# Patient Record
Sex: Male | Born: 1950
Health system: Southern US, Community
[De-identification: ages and names within clinical notes are randomized; demographics above are authoritative.]

## PROBLEM LIST (undated history)

## (undated) DIAGNOSIS — K219 Gastro-esophageal reflux disease without esophagitis: Secondary | ICD-10-CM

## (undated) DIAGNOSIS — H269 Unspecified cataract: Secondary | ICD-10-CM

## (undated) DIAGNOSIS — M1990 Unspecified osteoarthritis, unspecified site: Secondary | ICD-10-CM

## (undated) DIAGNOSIS — T7840XA Allergy, unspecified, initial encounter: Secondary | ICD-10-CM

## (undated) DIAGNOSIS — I10 Essential (primary) hypertension: Secondary | ICD-10-CM

## (undated) DIAGNOSIS — G4730 Sleep apnea, unspecified: Secondary | ICD-10-CM

## (undated) HISTORY — DX: Allergy, unspecified, initial encounter: T78.40XA

## (undated) HISTORY — PX: CATARACT EXTRACTION: SUR2

## (undated) HISTORY — DX: Unspecified osteoarthritis, unspecified site: M19.90

## (undated) HISTORY — DX: Unspecified cataract: H26.9

## (undated) HISTORY — DX: Gastro-esophageal reflux disease without esophagitis: K21.9

## (undated) HISTORY — PX: COLONOSCOPY: SHX174

## (undated) HISTORY — PX: ABCESS DRAINAGE: SHX399

## (undated) HISTORY — DX: Sleep apnea, unspecified: G47.30

---

## 2013-03-04 ENCOUNTER — Encounter (HOSPITAL_COMMUNITY): Payer: Self-pay | Admitting: Respiratory Therapy

## 2013-03-05 ENCOUNTER — Encounter (HOSPITAL_COMMUNITY)
Admission: RE | Admit: 2013-03-05 | Discharge: 2013-03-05 | Disposition: A | Discharge status: Home / Self Care | Payer: Worker's Compensation | Source: Ambulatory Visit | Attending: Orthopedic Surgery | Admitting: Orthopedic Surgery

## 2013-03-05 ENCOUNTER — Encounter (HOSPITAL_COMMUNITY)
Admission: RE | Admit: 2013-03-05 | Discharge: 2013-03-05 | Disposition: A | Discharge status: Home / Self Care | Payer: Worker's Compensation | Source: Ambulatory Visit | Attending: Anesthesiology | Admitting: Anesthesiology

## 2013-03-05 ENCOUNTER — Encounter (HOSPITAL_COMMUNITY): Payer: Self-pay

## 2013-03-05 ENCOUNTER — Other Ambulatory Visit: Payer: Self-pay | Admitting: Orthopedic Surgery

## 2013-03-05 HISTORY — DX: Essential (primary) hypertension: I10

## 2013-03-05 LAB — CBC
HCT: 43.2 % (ref 39.0–52.0)
Hemoglobin: 15 g/dL (ref 13.0–17.0)
MCH: 31.8 pg (ref 26.0–34.0)
MCHC: 34.7 g/dL (ref 30.0–36.0)
RBC: 4.71 MIL/uL (ref 4.22–5.81)

## 2013-03-05 LAB — BASIC METABOLIC PANEL
BUN: 13 mg/dL (ref 6–23)
Chloride: 100 mEq/L (ref 96–112)
Glucose, Bld: 100 mg/dL — ABNORMAL HIGH (ref 70–99)
Potassium: 3.7 mEq/L (ref 3.5–5.1)
Sodium: 138 mEq/L (ref 135–145)

## 2013-03-05 NOTE — Progress Notes (Signed)
Requested orders from Dr. Carlos Levering office

## 2013-03-05 NOTE — Pre-Procedure Instructions (Signed)
Gottlieb Zuercher  03/05/2013   Your procedure is scheduled on:  June 21  Report to [facility] Enter through main entrance A, Take east elevators to 3rd floor at 09:30 AM.  Call this number if you have problems the morning of surgery: [phone]   Remember:   Do not eat food or drink liquids after midnight.   Take these medicines the morning of surgery with A SIP OF WATER: None   Do not wear jewelry, make-up or nail polish.  Do not wear lotions, powders, or perfumes. You may wear deodorant.  Do not shave 48 hours prior to surgery. Men may shave face and neck.  Do not bring valuables to the hospital.  [facility] is not responsible for any belongings or valuables.  Contacts, dentures or bridgework may not be worn into surgery.  Leave suitcase in the car. After surgery it may be brought to your room.  For patients admitted to the hospital, checkout time is 11:00 AM the day of discharge.   Patients discharged the day of surgery will not be allowed to drive home.  Name and phone number of your driver: Family/ Friend  Special Instructions: Shower using CHG 2 nights before surgery and the night before surgery.  If you shower the day of surgery use CHG.  Use special wash - you have one bottle of CHG for all showers.  You should use approximately 1/3 of the bottle for each shower.   Please read over the following fact sheets that you were given: Pain Booklet, Coughing and Deep Breathing and Surgical Site Infection Prevention

## 2013-03-06 ENCOUNTER — Observation Stay (HOSPITAL_COMMUNITY)
Admission: RE | Admit: 2013-03-06 | Discharge: 2013-03-07 | Disposition: A | Discharge status: Home / Self Care | Payer: Worker's Compensation | Source: Ambulatory Visit | Attending: Orthopedic Surgery | Admitting: Orthopedic Surgery

## 2013-03-06 ENCOUNTER — Ambulatory Visit (HOSPITAL_COMMUNITY): Payer: Worker's Compensation | Admitting: Certified Registered Nurse Anesthetist

## 2013-03-06 ENCOUNTER — Encounter (HOSPITAL_COMMUNITY)
Admission: RE | Disposition: A | Discharge status: Home / Self Care | Payer: Self-pay | Source: Ambulatory Visit | Attending: Orthopedic Surgery

## 2013-03-06 ENCOUNTER — Encounter (HOSPITAL_COMMUNITY): Payer: Self-pay | Admitting: Certified Registered Nurse Anesthetist

## 2013-03-06 DIAGNOSIS — Z01812 Encounter for preprocedural laboratory examination: Secondary | ICD-10-CM | POA: Insufficient documentation

## 2013-03-06 DIAGNOSIS — S52509A Unspecified fracture of the lower end of unspecified radius, initial encounter for closed fracture: Secondary | ICD-10-CM | POA: Insufficient documentation

## 2013-03-06 DIAGNOSIS — Z01810 Encounter for preprocedural cardiovascular examination: Secondary | ICD-10-CM | POA: Insufficient documentation

## 2013-03-06 DIAGNOSIS — I10 Essential (primary) hypertension: Secondary | ICD-10-CM | POA: Insufficient documentation

## 2013-03-06 DIAGNOSIS — X58XXXA Exposure to other specified factors, initial encounter: Secondary | ICD-10-CM | POA: Insufficient documentation

## 2013-03-06 DIAGNOSIS — S52609A Unspecified fracture of lower end of unspecified ulna, initial encounter for closed fracture: Secondary | ICD-10-CM | POA: Insufficient documentation

## 2013-03-06 DIAGNOSIS — S52531A Colles' fracture of right radius, initial encounter for closed fracture: Secondary | ICD-10-CM

## 2013-03-06 DIAGNOSIS — S52599A Other fractures of lower end of unspecified radius, initial encounter for closed fracture: Principal | ICD-10-CM | POA: Insufficient documentation

## 2013-03-06 HISTORY — PX: ORIF WRIST FRACTURE: SHX2133

## 2013-03-06 SURGERY — OPEN REDUCTION INTERNAL FIXATION (ORIF) WRIST FRACTURE
Anesthesia: General | Site: Wrist | Laterality: Right | Wound class: Clean

## 2013-03-06 MED ORDER — ONDANSETRON HCL 4 MG/2ML IJ SOLN: 4.0000 mg | Freq: Four times a day (QID) | INTRAMUSCULAR | Status: DC | PRN

## 2013-03-06 MED ORDER — ONDANSETRON HCL 4 MG PO TABS: 4.0000 mg | ORAL_TABLET | Freq: Four times a day (QID) | ORAL | Status: DC | PRN

## 2013-03-06 MED ORDER — ONDANSETRON HCL 4 MG/2ML IJ SOLN
INTRAMUSCULAR | Status: DC | PRN
  Administered 2013-03-06: 4 mg via INTRAVENOUS

## 2013-03-06 MED ORDER — OXYCODONE HCL 5 MG/5ML PO SOLN: 5.0000 mg | Freq: Once | ORAL | Status: DC | PRN

## 2013-03-06 MED ORDER — CEFAZOLIN SODIUM-DEXTROSE 2-3 GM-% IV SOLR
2.0000 g | INTRAVENOUS | Status: AC
  Administered 2013-03-06: 2 g via INTRAVENOUS

## 2013-03-06 MED ORDER — VITAMIN C 500 MG PO TABS
1000.0000 mg | ORAL_TABLET | Freq: Every day | ORAL | Status: DC
  Administered 2013-03-07: 1000 mg via ORAL
  Filled 2013-03-06: qty 2

## 2013-03-06 MED ORDER — SODIUM CHLORIDE 0.9 % IR SOLN
Status: DC | PRN
  Administered 2013-03-06: 1000 mL

## 2013-03-06 MED ORDER — DOCUSATE SODIUM 100 MG PO CAPS
100.0000 mg | ORAL_CAPSULE | Freq: Two times a day (BID) | ORAL | Status: DC
  Administered 2013-03-06 – 2013-03-07 (×2): 100 mg via ORAL
  Filled 2013-03-06 (×2): qty 1

## 2013-03-06 MED ORDER — HYDROCHLOROTHIAZIDE 12.5 MG PO CAPS
12.5000 mg | ORAL_CAPSULE | Freq: Every day | ORAL | Status: DC
  Administered 2013-03-06 – 2013-03-07 (×2): 12.5 mg via ORAL
  Filled 2013-03-06 (×2): qty 1

## 2013-03-06 MED ORDER — PROMETHAZINE HCL 25 MG RE SUPP: 12.5000 mg | Freq: Four times a day (QID) | RECTAL | Status: DC | PRN

## 2013-03-06 MED ORDER — ONDANSETRON HCL 4 MG/2ML IJ SOLN: 4.0000 mg | Freq: Once | INTRAMUSCULAR | Status: DC | PRN

## 2013-03-06 MED ORDER — FAMOTIDINE 20 MG PO TABS
20.0000 mg | ORAL_TABLET | Freq: Two times a day (BID) | ORAL | Status: DC | PRN
  Filled 2013-03-06: qty 1

## 2013-03-06 MED ORDER — HYDROMORPHONE HCL PF 1 MG/ML IJ SOLN: 0.2500 mg | INTRAMUSCULAR | Status: DC | PRN

## 2013-03-06 MED ORDER — CEFAZOLIN SODIUM 1-5 GM-% IV SOLN
1.0000 g | INTRAVENOUS | Status: AC
  Administered 2013-03-06: 1 g via INTRAVENOUS

## 2013-03-06 MED ORDER — CEFAZOLIN SODIUM 1-5 GM-% IV SOLN
1.0000 g | Freq: Three times a day (TID) | INTRAVENOUS | Status: DC
  Administered 2013-03-06 – 2013-03-07 (×3): 1 g via INTRAVENOUS
  Filled 2013-03-06 (×4): qty 50

## 2013-03-06 MED ORDER — MORPHINE SULFATE 2 MG/ML IJ SOLN: 1.0000 mg | INTRAMUSCULAR | Status: DC | PRN

## 2013-03-06 MED ORDER — LACTATED RINGERS IV SOLN
INTRAVENOUS | Status: DC | PRN
  Administered 2013-03-06 (×2): via INTRAVENOUS

## 2013-03-06 MED ORDER — CEFAZOLIN SODIUM 1-5 GM-% IV SOLN
INTRAVENOUS | Status: AC
  Filled 2013-03-06: qty 50

## 2013-03-06 MED ORDER — LACTATED RINGERS IV SOLN
INTRAVENOUS | Status: DC
  Administered 2013-03-06: 16:00:00 via INTRAVENOUS

## 2013-03-06 MED ORDER — MIDAZOLAM HCL 2 MG/2ML IJ SOLN
INTRAMUSCULAR | Status: AC
  Filled 2013-03-06: qty 2

## 2013-03-06 MED ORDER — LACTATED RINGERS IV SOLN: INTRAVENOUS | Status: DC

## 2013-03-06 MED ORDER — OXYCODONE HCL 5 MG PO TABS
5.0000 mg | ORAL_TABLET | ORAL | Status: DC | PRN
  Administered 2013-03-06: 5 mg via ORAL
  Administered 2013-03-07: 10 mg via ORAL
  Filled 2013-03-06: qty 1
  Filled 2013-03-06: qty 2

## 2013-03-06 MED ORDER — DEXAMETHASONE SODIUM PHOSPHATE 10 MG/ML IJ SOLN
INTRAMUSCULAR | Status: DC | PRN
  Administered 2013-03-06: 10 mg

## 2013-03-06 MED ORDER — CHLORHEXIDINE GLUCONATE 4 % EX LIQD: 60.0000 mL | Freq: Once | CUTANEOUS | Status: DC

## 2013-03-06 MED ORDER — METHOCARBAMOL 100 MG/ML IJ SOLN
500.0000 mg | Freq: Four times a day (QID) | INTRAVENOUS | Status: DC | PRN
  Filled 2013-03-06: qty 5

## 2013-03-06 MED ORDER — CEFAZOLIN SODIUM-DEXTROSE 2-3 GM-% IV SOLR
INTRAVENOUS | Status: AC
  Filled 2013-03-06: qty 50

## 2013-03-06 MED ORDER — BUPIVACAINE-EPINEPHRINE PF 0.5-1:200000 % IJ SOLN
INTRAMUSCULAR | Status: DC | PRN
  Administered 2013-03-06: 25 mL

## 2013-03-06 MED ORDER — ADULT MULTIVITAMIN W/MINERALS CH
1.0000 | ORAL_TABLET | Freq: Every day | ORAL | Status: DC
  Administered 2013-03-07: 1 via ORAL
  Filled 2013-03-06: qty 1

## 2013-03-06 MED ORDER — LIDOCAINE HCL (CARDIAC) 20 MG/ML IV SOLN
INTRAVENOUS | Status: DC | PRN
  Administered 2013-03-06: 100 mg via INTRAVENOUS

## 2013-03-06 MED ORDER — FENTANYL CITRATE 0.05 MG/ML IJ SOLN
INTRAMUSCULAR | Status: AC
  Filled 2013-03-06: qty 2

## 2013-03-06 MED ORDER — ALPRAZOLAM 0.5 MG PO TABS: 0.5000 mg | ORAL_TABLET | Freq: Four times a day (QID) | ORAL | Status: DC | PRN

## 2013-03-06 MED ORDER — HYDROCHLOROTHIAZIDE 25 MG PO TABS: 12.5000 mg | ORAL_TABLET | Freq: Every day | ORAL | Status: DC

## 2013-03-06 MED ORDER — MIDAZOLAM HCL 5 MG/5ML IJ SOLN
INTRAMUSCULAR | Status: DC | PRN
  Administered 2013-03-06 (×2): 2 mg via INTRAVENOUS

## 2013-03-06 MED ORDER — OXYCODONE HCL 5 MG PO TABS: 5.0000 mg | ORAL_TABLET | Freq: Once | ORAL | Status: DC | PRN

## 2013-03-06 MED ORDER — FENTANYL CITRATE 0.05 MG/ML IJ SOLN
INTRAMUSCULAR | Status: DC | PRN
  Administered 2013-03-06: 100 ug via INTRAVENOUS
  Administered 2013-03-06 (×2): 50 ug via INTRAVENOUS

## 2013-03-06 MED ORDER — PROPOFOL INFUSION 10 MG/ML OPTIME
INTRAVENOUS | Status: DC | PRN
  Administered 2013-03-06: 25 ug/kg/min via INTRAVENOUS

## 2013-03-06 MED ORDER — METHOCARBAMOL 500 MG PO TABS
500.0000 mg | ORAL_TABLET | Freq: Four times a day (QID) | ORAL | Status: DC | PRN
  Administered 2013-03-07: 500 mg via ORAL
  Filled 2013-03-06: qty 1

## 2013-03-06 MED ORDER — PROPOFOL 10 MG/ML IV BOLUS
INTRAVENOUS | Status: DC | PRN
  Administered 2013-03-06: 20 mg via INTRAVENOUS
  Administered 2013-03-06: 10 mg via INTRAVENOUS
  Administered 2013-03-06 (×2): 20 mg via INTRAVENOUS
  Administered 2013-03-06: 30 mg via INTRAVENOUS

## 2013-03-06 SURGICAL SUPPLY — 56 items
BANDAGE ELASTIC 3 VELCRO ST LF (GAUZE/BANDAGES/DRESSINGS) ×2 IMPLANT
BANDAGE ELASTIC 4 VELCRO ST LF (GAUZE/BANDAGES/DRESSINGS) ×2 IMPLANT
BANDAGE GAUZE ELAST BULKY 4 IN (GAUZE/BANDAGES/DRESSINGS) ×2 IMPLANT
BIT DRILL 2.2 SS TIBIAL (BIT) ×2 IMPLANT
BLADE SURG ROTATE 9660 (MISCELLANEOUS) IMPLANT
BNDG ESMARK 4X9 LF (GAUZE/BANDAGES/DRESSINGS) ×2 IMPLANT
CLOTH BEACON ORANGE TIMEOUT ST (SAFETY) ×2 IMPLANT
CORDS BIPOLAR (ELECTRODE) ×2 IMPLANT
COVER SURGICAL LIGHT HANDLE (MISCELLANEOUS) ×2 IMPLANT
CUFF TOURNIQUET SINGLE 18IN (TOURNIQUET CUFF) ×2 IMPLANT
CUFF TOURNIQUET SINGLE 24IN (TOURNIQUET CUFF) IMPLANT
DRAIN TLS ROUND 10FR (DRAIN) IMPLANT
DRAPE OEC MINIVIEW 54X84 (DRAPES) IMPLANT
DRAPE SURG 17X23 STRL (DRAPES) ×2 IMPLANT
DRSG EMULSION OIL 3X3 NADH (GAUZE/BANDAGES/DRESSINGS) ×2 IMPLANT
GAUZE XEROFORM 1X8 LF (GAUZE/BANDAGES/DRESSINGS) ×2 IMPLANT
GLOVE BIOGEL M STRL SZ7.5 (GLOVE) ×2 IMPLANT
GLOVE SS BIOGEL STRL SZ 8 (GLOVE) ×1 IMPLANT
GLOVE SUPERSENSE BIOGEL SZ 8 (GLOVE) ×1
GOWN PREVENTION PLUS XLARGE (GOWN DISPOSABLE) ×2 IMPLANT
GOWN STRL NON-REIN LRG LVL3 (GOWN DISPOSABLE) ×6 IMPLANT
GOWN STRL REIN XL XLG (GOWN DISPOSABLE) ×4 IMPLANT
KIT BASIN OR (CUSTOM PROCEDURE TRAY) ×2 IMPLANT
KIT ROOM TURNOVER OR (KITS) ×2 IMPLANT
LOOP VESSEL MAXI BLUE (MISCELLANEOUS) IMPLANT
MANIFOLD NEPTUNE II (INSTRUMENTS) ×2 IMPLANT
NEEDLE 22X1 1/2 (OR ONLY) (NEEDLE) IMPLANT
NS IRRIG 1000ML POUR BTL (IV SOLUTION) ×2 IMPLANT
PACK ORTHO EXTREMITY (CUSTOM PROCEDURE TRAY) ×2 IMPLANT
PAD ARMBOARD 7.5X6 YLW CONV (MISCELLANEOUS) ×4 IMPLANT
PAD CAST 3X4 CTTN HI CHSV (CAST SUPPLIES) ×1 IMPLANT
PAD CAST 4YDX4 CTTN HI CHSV (CAST SUPPLIES) ×1 IMPLANT
PADDING CAST COTTON 3X4 STRL (CAST SUPPLIES) ×1
PADDING CAST COTTON 4X4 STRL (CAST SUPPLIES) ×1
PEG LOCKING SMOOTH 2.2X18 (Peg) ×4 IMPLANT
PEG LOCKING SMOOTH 2.2X20 (Screw) ×6 IMPLANT
SCREW LOCK 14X2.7X 3 LD TPR (Screw) ×2 IMPLANT
SCREW LOCK 16X2.7X 3 LD TPR (Screw) ×1 IMPLANT
SCREW LOCK 20X2.7X 3 LD TPR (Screw) ×2 IMPLANT
SCREW LOCKING 2.7X14 (Screw) ×2 IMPLANT
SCREW LOCKING 2.7X16 (Screw) ×1 IMPLANT
SCREW LOCKING 2.7X20MM (Screw) ×2 IMPLANT
SCREW NONLOCK 2.7X18MM (Screw) ×2 IMPLANT
SPONGE GAUZE 4X4 12PLY (GAUZE/BANDAGES/DRESSINGS) ×2 IMPLANT
SPONGE LAP 4X18 X RAY DECT (DISPOSABLE) IMPLANT
SUT MNCRL AB 4-0 PS2 18 (SUTURE) IMPLANT
SUT PROLENE 3 0 PS 2 (SUTURE) IMPLANT
SUT PROLENE 4 0 PS 2 18 (SUTURE) ×4 IMPLANT
SUT VIC AB 3-0 FS2 27 (SUTURE) ×4 IMPLANT
SYR CONTROL 10ML LL (SYRINGE) IMPLANT
SYSTEM CHEST DRAIN TLS 7FR (DRAIN) ×2 IMPLANT
TOWEL OR 17X24 6PK STRL BLUE (TOWEL DISPOSABLE) ×2 IMPLANT
TOWEL OR 17X26 10 PK STRL BLUE (TOWEL DISPOSABLE) ×2 IMPLANT
TUBE CONNECTING 12X1/4 (SUCTIONS) ×2 IMPLANT
TUBE EVACUATION TLS (MISCELLANEOUS) ×2 IMPLANT
WATER STERILE IRR 1000ML POUR (IV SOLUTION) ×2 IMPLANT

## 2013-03-06 NOTE — Progress Notes (Signed)
Orthopedic Tech Progress Note Patient Details:  Troy [name] [birth year] [id number]  Ortho Devices Type of Ortho Device: Arm sling Ortho Device/Splint Location: right arm Ortho Device/Splint Interventions: Application   Nikki Dom 03/06/2013, 4:21 PM

## 2013-03-06 NOTE — Op Note (Signed)
See dictation #[id number] Dominica Severin M.D.

## 2013-03-06 NOTE — Op Note (Signed)
NAMEHAMILTON, MARINELLO NO.:  [id number]  MEDICAL RECORD NO.:  [id number]  LOCATION:  [facility]                         FACILITY:  [facility]  PHYSICIAN:  Dionne Ano. Gail Creekmore, M.D.DATE OF BIRTH:  05/07/1951  DATE OF PROCEDURE: DATE OF DISCHARGE:                              OPERATIVE REPORT   PREOPERATIVE DIAGNOSIS:  Comminuted complex greater than 3-part intra- articular distal radius fracture, right upper extremity.  POSTOPERATIVE DIAGNOSIS:  Comminuted complex greater than 3-part intra- articular distal radius fracture, right upper extremity.  PROCEDURE: 1. Open reduction, internal fixation distal radius fracture, right     upper extremity. 2. Stress radiography. 3. Sliding brachioradialis tenotomy. 4. Closed treatment ulnar styloid fracture with evaluation anesthesia.  SURGEON:  Dionne Ano. Amanda Pea, M.D.  ASSISTANT:  Karie Chimera, P.A.-C.  ANESTHESIA:  Block with supplemental MAC.  COMPLICATIONS:  None.  DRAINS:  1.  ESTIMATED BLOOD LOSS:  Minimal.  TOURNIQUET TIME:  Less than an hour.  INDICATIONS:  Pleasant male, who presents with the above-mentioned diagnosis.  I have counseled him in regards to risks and benefits of surgery including risk of infection, bleeding, anesthesia, damage to normal structures, and failure of surgery to accomplish its intended goals of relieving symptoms and restoring function.  With this in mind, he desires to proceed.  All questions have been encouraged and answered preoperatively.  OPERATIVE PROCEDURE:  The patient was seen by myself and anesthesia, taken to the operative suite, preoperatively he had a block placed without difficulty.  I should note he was given preoperative antibiotics, and his MRSA and staph screen were negative.  The patient tolerated the block by Dr. Sondra Come excellently and thus we chose a monitored anesthesia with IV sedation and block for anesthetic purposes. The patient was seen by myself and  anesthesia, underwent smooth induction of monitored anesthesia care with IV sedation.  Arm was prepped and draped in a usual sterile fashion, Betadine scrub and paint. Following this, the patient then underwent final time-out.  Pre and postop check list were completed.  He was well padded, and underwent tourniquet insufflation.  Following this, incision was made volar radially.  Dissection was carried down.  FCR tendon sheath was incised palmarly and dorsally.  Fasciotomy was accomplished approximately. Following this carpal canal contents retracted ulnarly.  Pronator was incised in an L-shaped fashion.  Radial artery was protected.  Carpal canal contents and pronator were elevated in a radial to ulnar direction.  A Jung retractor was then placed.  Following this, we outlined the fracture was comminuted, complex in nature.  It was reduced without difficulty and following reduction, I then applied a DVR plate and screw construct.  I should note that brachioradialis tenotomy was accomplished with sliding technique to decrease the deforming forces of the tendon and help reduce the styloid.  The DVR plate was applied without difficulty, and was accomplished with a standard AO technique.  I take very careful look to make sure and maintain proper length position, etc.  I should note radial high volar inclination and radial inclination were all restored to acceptable geometric patterns.  Following this, I then stress tested the distal radioulnar joint, it was stable in pronation, supination,  and neutral, and thus we chose to treat the ulnar styloid fracture in a closed fashion with the distal radial ulnar joint looking excellent and congruous.  Following this, we irrigated copiously, closed the pronator with Vicryl, and then closed the skin edge with Prolene.  The patient tolerated this well, and there were no complicating features.  He was placed in a short-arm splint.  He will be  monitored in the recovery room.  It has been a night for IV antibiotics, general postoperative observation, and make sure that things go smoothly.  I have discussed with the patient the pre and postop algorithm, etc.  We will plan for suture removal in 12-14 days, and casting between weeks 2 and 4.  Following this, we will plan for a splint at 4 weeks and strengthening at 8 weeks with gentle motion between weeks 4 and 8. These notes had been discussed.  All questions have been encouraged and answered.     Dionne Ano. Amanda Pea, M.D.     [facility]  D:  03/06/2013  T:  03/06/2013  Job:  [id number]

## 2013-03-06 NOTE — Transfer of Care (Signed)
Immediate Anesthesia Transfer of Care Note  Patient: Troy [name]  Procedure(s) Performed: Procedure(s): OPEN REDUCTION INTERNAL FIXATION (ORIF) RIGHT WRIST FRACTURE with allograft bone graft and repair reconstruction as necessary (Right)  Patient Location: PACU  Anesthesia Type:MAC and Regional  Level of Consciousness: awake, alert , oriented and patient cooperative  Airway & Oxygen Therapy: Patient Spontanous Breathing  Post-op Assessment: Report given to PACU RN, Post -op Vital signs reviewed and stable and Patient moving all extremities X 4  Post vital signs: Reviewed and stable  Complications: No apparent anesthesia complications

## 2013-03-06 NOTE — Anesthesia Preprocedure Evaluation (Signed)

## 2013-03-06 NOTE — Anesthesia Procedure Notes (Signed)
Anesthesia Regional Block:  Supraclavicular block  Pre-Anesthetic Checklist: ,, timeout performed, Correct Patient, Correct Site, Correct Laterality, Correct Procedure, Correct Position, site marked, Risks and benefits discussed,  Surgical consent,  Pre-op evaluation,  At surgeon's request and post-op pain management  Laterality: Right and Upper  Prep: chloraprep       Needles:  Injection technique: Single-shot  Needle Type: Echogenic Needle     Needle Length: 5cm 5 cm Needle Gauge: 21    Additional Needles:  Procedures: ultrasound guided (picture in chart) Supraclavicular block Narrative:  Start time: 03/06/2013 10:21 AM End time: 03/06/2013 10:27 AM Injection made incrementally with aspirations every 5 mL.  Performed by: Personally  Anesthesiologist: Sheldon Silvan  Supraclavicular block

## 2013-03-06 NOTE — Preoperative (Signed)
Beta Blockers   Reason not to administer Beta Blockers:Not Applicable 

## 2013-03-06 NOTE — Anesthesia Postprocedure Evaluation (Signed)
  Anesthesia Post-op Note  Patient: Troy [name]  Procedure(s) Performed: Procedure(s): OPEN REDUCTION INTERNAL FIXATION (ORIF) RIGHT WRIST FRACTURE with allograft bone graft and repair reconstruction as necessary (Right)  Patient Location: PACU  Anesthesia Type:MAC and GA combined with regional for post-op pain  Level of Consciousness: awake, alert  and oriented  Airway and Oxygen Therapy: Patient Spontanous Breathing  Post-op Pain: none  Post-op Assessment: Post-op Vital signs reviewed  Post-op Vital Signs: Reviewed  Complications: No apparent anesthesia complications

## 2013-03-06 NOTE — H&P (Signed)
Troy [name] is an 62 y.o. male.   Chief Complaint: Right Radius fracture closed comminuted with displacement HPI: Patient presents for open reduction internal fixation of his radius fracture. He understands risk and benefits and desires to proceed.Marland KitchenMarland KitchenPatient presents for evaluation and treatment of the of their upper extremity predicament. The patient denies neck back chest or of abdominal pain. The patient notes that they have no lower extremity problems. The patient from primarily complains of the upper extremity pain noted.  Past Medical History  Diagnosis Date  . Hypertension     does not see a cardiologist    Past Surgical History  Procedure Laterality Date  . Abcess drainage      right neck    No family history on file. Social History:  reports that he has never smoked. He has never used smokeless tobacco. He reports that he does not drink alcohol or use illicit drugs.  Allergies:  Allergies  Allergen Reactions  . Clindamycin/Lincomycin Hives    Medications Prior to Admission  Medication Sig Dispense Refill  . hydrochlorothiazide (HYDRODIURIL) 12.5 MG tablet Take 12.5 mg by mouth daily.      . Multiple Vitamin (MULTIVITAMIN WITH MINERALS) TABS Take 1 tablet by mouth daily.        Results for orders placed during the hospital encounter of 03/05/13 (from the past 48 hour(s))  BASIC METABOLIC PANEL     Status: Abnormal   Collection Time    03/05/13 11:56 AM      Result Value Range   Sodium 138  135 - 145 mEq/L   Potassium 3.7  3.5 - 5.1 mEq/L   Chloride 100  96 - 112 mEq/L   CO2 28  19 - 32 mEq/L   Glucose, Bld 100 (*) 70 - 99 mg/dL   BUN 13  6 - 23 mg/dL   Creatinine, Ser 6.21  0.50 - 1.35 mg/dL   Calcium 9.4  8.4 - 30.8 mg/dL   GFR calc non Af Amer 79 (*) >90 mL/min   GFR calc Af Amer >90  >90 mL/min   Comment:            The eGFR has been calculated     using the CKD EPI equation.     This calculation has not been     validated in all clinical   situations.     eGFR's persistently     <90 mL/min signify     possible Chronic Kidney Disease.  CBC     Status: None   Collection Time    03/05/13 11:56 AM      Result Value Range   WBC 7.7  4.0 - 10.5 K/uL   RBC 4.71  4.22 - 5.81 MIL/uL   Hemoglobin 15.0  13.0 - 17.0 g/dL   HCT 65.7  84.6 - 96.2 %   MCV 91.7  78.0 - 100.0 fL   MCH 31.8  26.0 - 34.0 pg   MCHC 34.7  30.0 - 36.0 g/dL   RDW 95.2  84.1 - 32.4 %   Platelets 250  150 - 400 K/uL  SURGICAL PCR SCREEN     Status: None   Collection Time    03/05/13 11:57 AM      Result Value Range   MRSA, PCR NEGATIVE  NEGATIVE   Staphylococcus aureus NEGATIVE  NEGATIVE   Comment:            The Xpert SA Assay (FDA     approved for  NASAL specimens     in patients over 59 years of age),     is one component of     a comprehensive surveillance     program.  Test performance has     been validated by [employer] for patients greater     than or equal to 65 year old.     It is not intended     to diagnose infection nor to     guide or monitor treatment.   Dg Chest 2 View  03/05/2013   *RADIOLOGY REPORT*  Clinical Data: History of hypertension  CHEST - 2 VIEW  Comparison: None.  Findings: Heart and mediastinal contours are within normal limits. The lung fields are well aerated and show some mild increase in interstitial markings suggesting underlying bronchitic change.  No focal infiltrates or signs of congestive failure seen.  No pleural fluid or significant peribronchial cuffing is identified.  Bony structures are notable for degenerative change of the lower thoracic spine are otherwise intact.  IMPRESSION: Findings suggestive of mild bronchitic change with no worrisome focal or acute abnormality seen   Original Report Authenticated By: Rhodia Albright, M.D.    Review of Systems  Constitutional: Negative.   Respiratory: Negative.   Cardiovascular: Negative.   Gastrointestinal: Negative.   Genitourinary: Negative.   Skin:  Negative.   Neurological: Negative.   Endo/Heme/Allergies: Negative.   Psychiatric/Behavioral: Negative.     Blood pressure 131/96, pulse 89, temperature 97.8 F (36.6 C), resp. rate 18, SpO2 96.00%. Physical Exam   fracture of the right radius with comminution and displacement we'll plan for ORIF patient is neurovascularly intact about his fingers with normal sensation and motor function.   [name].The patient is alert and oriented in no acute distress the patient complains of pain in the affected upper extremity.  The patient is noted to have a normal HEENT exam.  Lung fields show equal chest expansion and no shortness of breath  abdomen exam is nontender without distention.  Lower extremity examination does not show any fracture dislocation or blood clot symptoms.  Pelvis is stable neck and back are stable and nontender    A ssessment/Plan Fracture radius closed with comminution- we'll plan open reduction internal fixation  Ahman Dugdale III,Skylor Schnapp M 03/06/2013, 11:28 AM

## 2013-03-07 NOTE — Progress Notes (Signed)
Patient ID: Troy [name], male   DOB: [birth year], 62 y.o.   MRN: [id number] .Marland KitchenPatient has been seen and examined. Patient has pain appropriate to his injury/process. Patient denies new complaints at this present time. I have discussed his care with nursing staff. Patient is appropriate and alert.  We reviewed vital signs and intake output which are stable.  The upper extremity is neurovascularly intact. Refill is normal. There is no signs of compartment syndrome. There is no signs of dystrophy. There is normal sensation.  Lives at great deal of time discussing range of motion lives and other techniques to decrease edema and promote flexion extension of the fingers. Patient understands the importance of elevation range of motion massage and other measures to lessen pain and prevent swelling.  We have discussed with the patient shoulder range of motion to prevent adhesive capsulitis.  The remainder of the examination is normal today without complicating feature.  Drain was removed without difficulty  Patient will be discharged home. Will plan to see the patient back in the off as per discharge instructions (please see discharge instructions).  Patient had an uneventful hospital course. At the time of discharge patient is stable awake alert and oriented in no acute distress. Regular diet will be continued and has been tolerated. Patient will notify should have problems occur. There is no signs of DVT infection or other complication at this juncture.  All questions have been incurred and answered.  Dominica Severin

## 2013-03-07 NOTE — Discharge Summary (Signed)
..  Patient has been seen and examined. Patient has pain appropriate to his injury/process. Patient denies new complaints at this present time. I have discussed his care with nursing staff. Patient is appropriate and alert.  We reviewed vital signs and intake output which are stable.  The upper extremity is neurovascularly intact. Refill is normal. There is no signs of compartment syndrome. There is no signs of dystrophy. There is normal sensation.  Lives at great deal of time discussing range of motion lives and other techniques to decrease edema and promote flexion extension of the fingers. Patient understands the importance of elevation range of motion massage and other measures to lessen pain and prevent swelling.  We have discussed with the patient shoulder range of motion to prevent adhesive capsulitis.  The remainder of the examination is normal today without complicating feature.  Drain was removed without difficulty  Patient will be discharged home. Will plan to see the patient back in the off as per discharge instructions (please see discharge instructions).  Patient had an uneventful hospital course. At the time of discharge patient is stable awake alert and oriented in no acute distress. Regular diet will be continued and has been tolerated. Patient will notify should have problems occur. There is no signs of DVT infection or other complication at this juncture.  All questions have been incurred and answered.  Dominica Severin

## 2013-03-07 NOTE — Progress Notes (Signed)
Occupational Therapy Evaluation Patient Details Name: Troy [name] MRN: [id number] DOB: 05-Apr-1951 Today's Date: 03/07/2013 Time: 7829-5621 OT Time Calculation (min): 14 min  OT Assessment / Plan / Recommendation Clinical Impression  Patient presents s/p surgical repair of R wrist fx. All education complete and patient able to manage ADLs with PRN A from girlfriend. No further OT needs.    OT Assessment  Patient does not need any further OT services    Follow Up Recommendations  No OT follow up    Barriers to Discharge      Equipment Recommendations  None recommended by OT    Recommendations for Other Services    Frequency       Precautions / Restrictions Precautions Required Braces or Orthoses: Other Brace/Splint (sling RUE) Restrictions Weight Bearing Restrictions: Yes   Pertinent Vitals/Pain     ADL  Eating/Feeding: Performed;Independent Where Assessed - Eating/Feeding: Edge of bed Grooming: Performed;Teeth care;Wash/dry face;Independent Where Assessed - Grooming: Unsupported standing Upper Body Bathing: Performed;Modified independent Where Assessed - Upper Body Bathing: Unsupported standing Lower Body Bathing: Performed;Modified independent Where Assessed - Lower Body Bathing: Unsupported sit to stand Upper Body Dressing: Performed;Modified independent Where Assessed - Upper Body Dressing: Unsupported sitting Lower Body Dressing: Performed;Modified independent Where Assessed - Lower Body Dressing: Unsupported sit to stand Toilet Transfer: Performed;Independent Toilet Transfer Method: Sit to Barista: Regular height toilet Toileting - Clothing Manipulation and Hygiene: Performed;Independent Where Assessed - Toileting Clothing Manipulation and Hygiene: Sit to stand from 3-in-1 or toilet Tub/Shower Transfer: Simulated;Modified independent Tub/Shower Transfer Method: Ambulating Transfers/Ambulation Related to ADLs: Patient is I with  transfers and ambulation. ADL Comments: Patient educated on one-handed techniques for ADLs but has had to utilize these prior to surgery so he is able to do all ADLs. MD office gave him a plastic cover for his arm so he can shower at home. Educated to have girlfriend there for first shower.    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 Toilet: Standard Home Adaptive Equipment: None Prior Function Level of Independence: Independent Able to Take Stairs?: Yes Driving: Yes Vocation: Full time employment Communication Communication: No difficulties Dominant Hand: Right         End of Session OT - End of Session Activity Tolerance: Patient tolerated treatment well Patient left: in bed;with call bell/phone within reach  GO Functional Limitation: Self care Self Care Current Status (H0865): At least 1 percent but less than 20 percent impaired, limited or restricted Self Care Goal Status (H8469): At least 1 percent but less than 20 percent impaired, limited or restricted Self Care Discharge Status [phone]): At least 1 percent but less than 20 percent impaired, limited or restricted   Troy [name] A 03/07/2013, 11:26 AM

## 2013-03-09 ENCOUNTER — Encounter (HOSPITAL_COMMUNITY): Payer: Self-pay | Admitting: Orthopedic Surgery

## 2014-03-19 IMAGING — CR DG CHEST 2V
2 series · 2 of 2 positions shown · non-contrast
Comparison: None.

CLINICAL DATA: History of hypertension

CHEST - 2 VIEW

[view not recorded (1 of 2)]
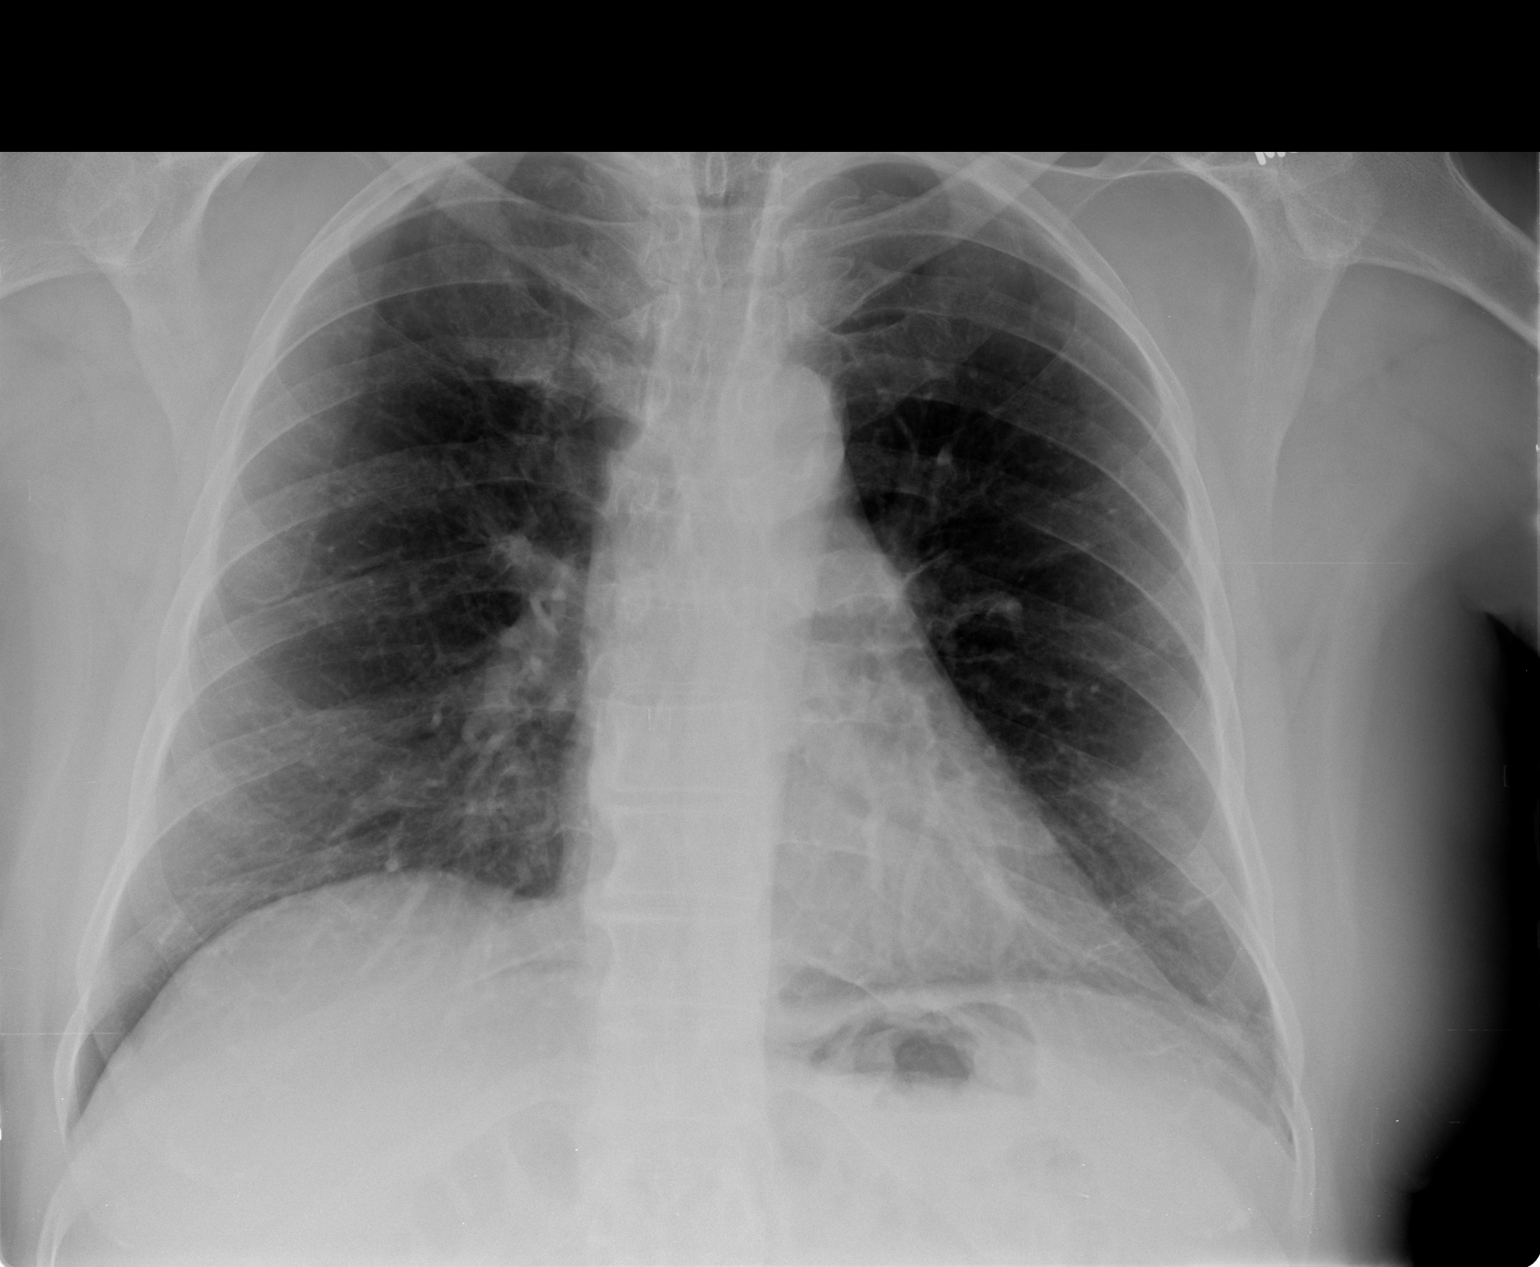

[view not recorded (2 of 2)]
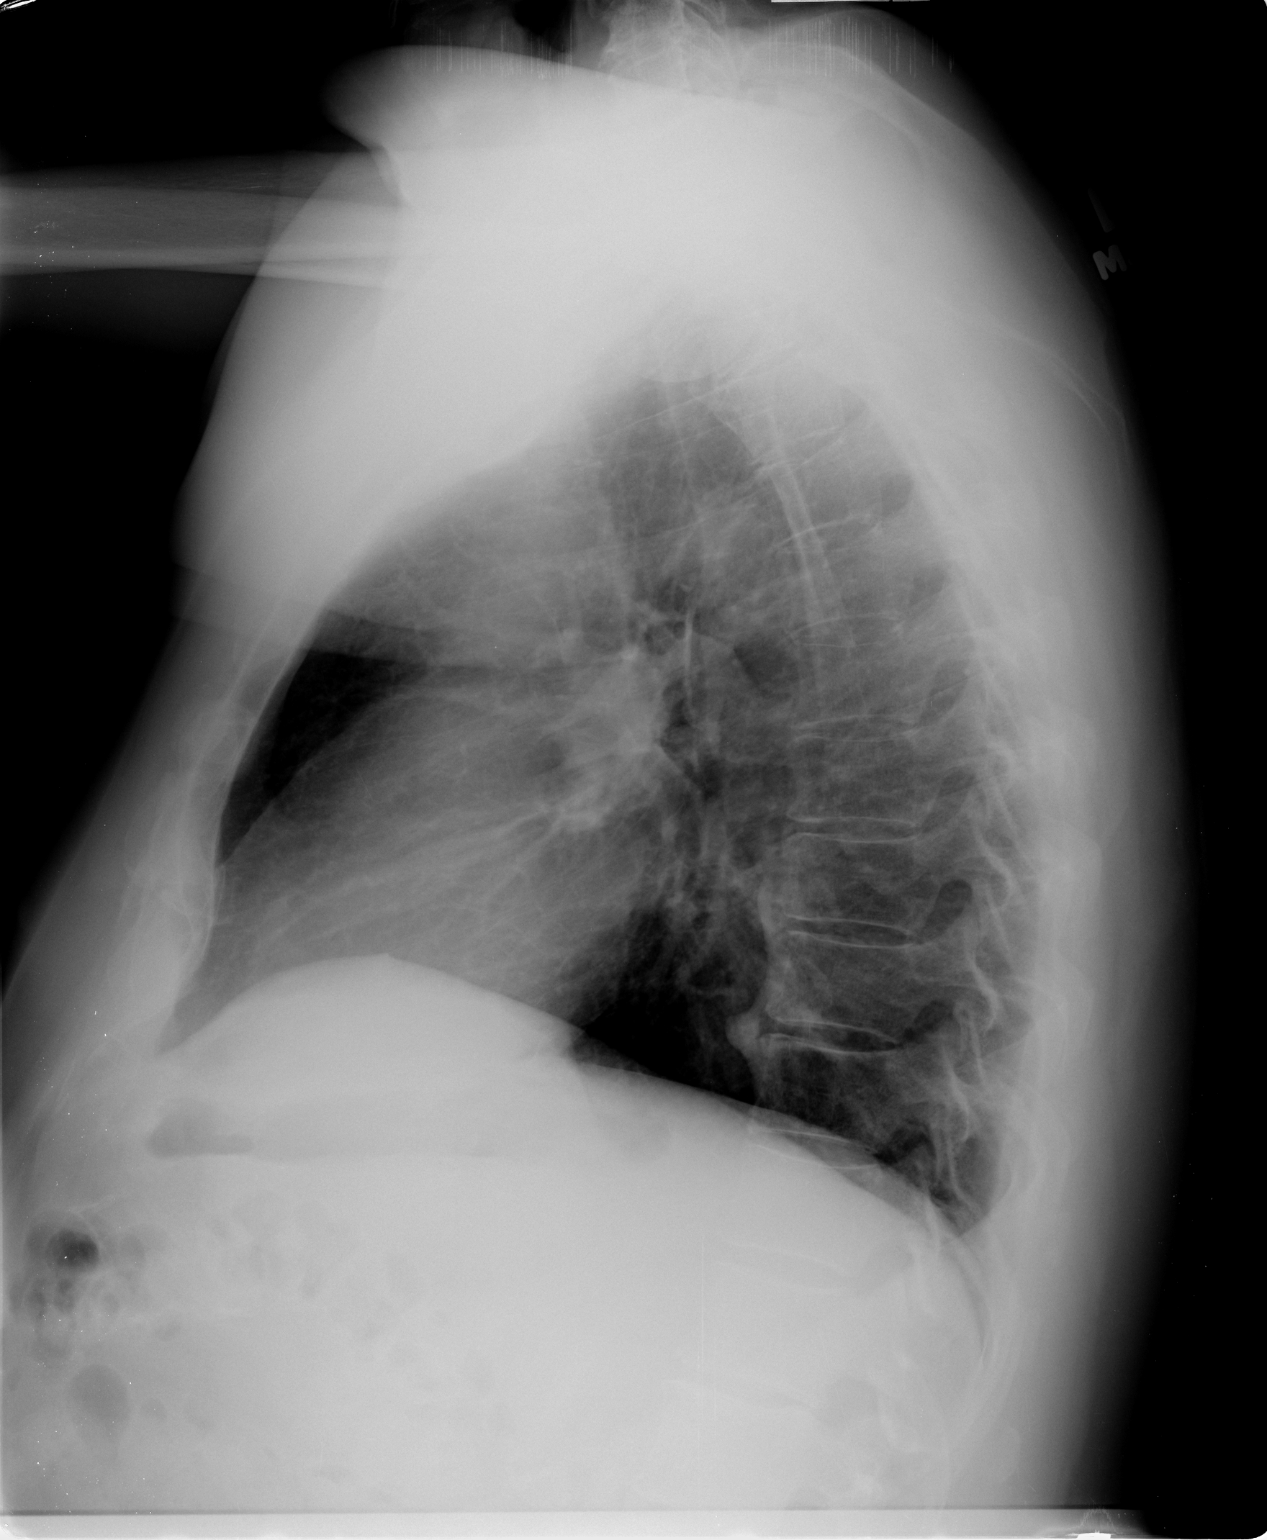

[2 of 2 positions shown; findings below may reference images not displayed]

FINDINGS: Heart and mediastinal contours are within normal limits.
The lung fields are well aerated and show some mild increase in
interstitial markings suggesting underlying bronchitic change.  No
focal infiltrates or signs of congestive failure seen.  No pleural
fluid or significant peribronchial cuffing is identified.

Bony structures are notable for degenerative change of the lower
thoracic spine are otherwise intact.
IMPRESSION: Findings suggestive of mild bronchitic change with no worrisome
focal or acute abnormality seen

## 2014-12-16 ENCOUNTER — Encounter: Payer: Self-pay | Admitting: Internal Medicine

## 2015-02-01 ENCOUNTER — Ambulatory Visit (AMBULATORY_SURGERY_CENTER): Payer: Self-pay | Admitting: *Deleted

## 2015-02-01 VITALS — Ht 70.0 in | Wt 227.0 lb

## 2015-02-01 DIAGNOSIS — Z1211 Encounter for screening for malignant neoplasm of colon: Secondary | ICD-10-CM

## 2015-02-01 MED ORDER — NA SULFATE-K SULFATE-MG SULF 17.5-3.13-1.6 GM/177ML PO SOLN
1.0000 | Freq: Once | ORAL | Status: DC
Start: 2015-02-01 — End: 2015-03-10

## 2015-02-01 NOTE — Progress Notes (Signed)
No egg or soy allergy. No anesthesia problems.  No home O2.  No diet meds.  

## 2015-02-08 ENCOUNTER — Telehealth: Payer: Self-pay | Admitting: Internal Medicine

## 2015-02-08 NOTE — Telephone Encounter (Signed)
Called pt back, pt states he is worried about the colonoscopy with his bp being high right now because of his sciatica flare up, pt reports BP of 160/100, asked pt if he has spoken to his primary care doctor, pt states he has and they gave him flexeril and "something else". Pt states he has been in bed and can't move because of the pain, pt states flexeril is not helping much, advised pt he needs to speak to primary care if flexeril is not helping and may need something else, pt said they offered a "shot" but it would stay in his system for 3 weeks and he could not have a colonoscopy because of it so he chose not have it. I advised pt that if he was in that much pain, we could postpone colonoscopy (screening, pt not having any issues), pt wants to wait until Friday 02/10/15 before he decides, pt is going to recheck his BP and see how he feels then call us back by Friday if he needs to reschedule.  Is this ok since he is past his cancellation date? Do you have any additional thoughts?-adm

## 2015-02-08 NOTE — Telephone Encounter (Signed)
There would be no rescheduling or cancellation charges Agree he should contact PCP if not improving  If not improving by weeks end, then would reschedule screening colonoscopy to allow time for improvement in sciatica flare

## 2015-02-09 NOTE — Telephone Encounter (Signed)
Pt called back and said he received injection of kenalog yesterday 03/11/15 so he needs to reschedule his colonoscopy for at least 3 weeks out, rescheduled procedure for 6/28 at 3pm. New instructions were given and mailed, pt will call if any question.-adm

## 2015-02-15 ENCOUNTER — Encounter: Payer: Self-pay | Admitting: Internal Medicine

## 2015-02-16 ENCOUNTER — Encounter: Payer: Self-pay | Admitting: Internal Medicine

## 2015-03-10 ENCOUNTER — Ambulatory Visit (AMBULATORY_SURGERY_CENTER): Payer: 59 | Admitting: Internal Medicine

## 2015-03-10 ENCOUNTER — Encounter: Payer: Self-pay | Admitting: Internal Medicine

## 2015-03-10 VITALS — BP 144/78 | HR 75 | Temp 98.4°F | Resp 19 | Ht 70.0 in | Wt 227.0 lb

## 2015-03-10 DIAGNOSIS — D122 Benign neoplasm of ascending colon: Secondary | ICD-10-CM

## 2015-03-10 DIAGNOSIS — Z1211 Encounter for screening for malignant neoplasm of colon: Secondary | ICD-10-CM

## 2015-03-10 DIAGNOSIS — D123 Benign neoplasm of transverse colon: Secondary | ICD-10-CM | POA: Diagnosis not present

## 2015-03-10 DIAGNOSIS — D120 Benign neoplasm of cecum: Secondary | ICD-10-CM

## 2015-03-10 DIAGNOSIS — D125 Benign neoplasm of sigmoid colon: Secondary | ICD-10-CM

## 2015-03-10 DIAGNOSIS — C180 Malignant neoplasm of cecum: Secondary | ICD-10-CM | POA: Diagnosis not present

## 2015-03-10 MED ORDER — SODIUM CHLORIDE 0.9 % IV SOLN: 500.0000 mL | INTRAVENOUS | Status: DC

## 2015-03-10 NOTE — Op Note (Signed)
[place]  [employer] [address]. [place], [address]   COLONOSCOPY PROCEDURE REPORT  PATIENT: Troy [name], Troy [name]  MR#: [id number] BIRTHDATE: [birth year] , 65  yrs. old GENDER: male ENDOSCOPIST: Jerene Bears, MD REFERRED RJ:JOAC Uppin, M.D. PROCEDURE DATE:  03/10/2015 PROCEDURE:   Colonoscopy, screening, Colonoscopy with snare polypectomy, Colonoscopy with cold biopsy polypectomy, and Submucosal injection, any substance First Screening Colonoscopy - Avg.  risk and is 50 yrs.  old or older Yes.  Prior Negative Screening - Now for repeat screening. N/A  History of Adenoma - Now for follow-up colonoscopy & has been > or = to 3 yrs.  N/A  Polyps removed today? Yes ASA CLASS:   Class II INDICATIONS:Screening for colonic neoplasia and Colorectal Neoplasm Risk Assessment for this procedure is average risk. MEDICATIONS: Monitored anesthesia care, Propofol 700 mg IV, and Lidocaine 40 mg IV  DESCRIPTION OF PROCEDURE:   After the risks benefits and alternatives of the procedure were thoroughly explained, informed consent was obtained.  The digital rectal exam revealed no rectal mass.   The LB [id number]  endoscope was introduced through the anus and advanced to the cecum, which was identified by both the appendix and ileocecal valve. No adverse events experienced. The quality of the prep was good.  (Suprep was used)  The instrument was then slowly withdrawn as the colon was fully examined. Estimated blood loss is zero unless otherwise noted in this procedure report.      COLON FINDINGS: A pedunculated polyp with very thick stalk measuring 40 mm in size with a friable surface was found in the sigmoid colon. This polyp was located at approximately 30 cm from the dentate line. A polypectomy was performed using snare cautery.  The resection was complete, the polyp tissue was completely retrieved and sent to histology. Initially no bleeding at polypectomy site, but within 1  minute of observation, oozing was seen from the polypectomy base.  Bleeding at the site was controlled using hemoclips.  Three (3) placements were made, one clip misfired. There was minimal blood loss from maneuver subsiding by end of procedure. A tattoo was placed proximal and distal to the polypectomy base with [place] ink.    Four sessile polyps ranging between 3-70mm in size were found at the cecum (1) and in the ascending colon (3).  Polypectomies were performed with a cold snare (3) and hot snare (1).  The resection was complete, the polyp tissue was completely retrieved and sent to histology.   Three sessile polyps ranging between 3-79mm in size were found in the transverse colon.  Polypectomies were performed with cold forceps (1) and with a cold snare (2).  The resection was complete, the polyp tissue was completely retrieved and sent to histology. There was moderate diverticulosis noted in the descending colon and sigmoid colon.  Retroflexed views revealed internal hemorrhoids. The time to cecum = 0.6 Withdrawal time = 35.0   The scope was withdrawn and the procedure completed.  COMPLICATIONS: There were no immediate complications.  ENDOSCOPIC IMPRESSION: 1.   Pedunculated polyp was found in the sigmoid colon; polypectomy was performed using snare cautery; bleeding at the site was controlled using hemoclips; mucosal tattoo placed 2.   Four sessile polyps ranging between 3-85mm in size were found at the cecum and in the ascending colon; polypectomies were performed with a cold snare and hot snare 3.   Three sessile polyps ranging between 3-2mm in size were found in the transverse colon; polypectomies were performed with  cold forceps and with a cold snare 4.   Moderate diverticulosis was noted in the descending colon and sigmoid colon  RECOMMENDATIONS: 1.  Hold Aspirin and all other NSAIDs for at least 2 weeks. 2.  Await pathology results 3.  Timing of repeat colonoscopy will  be determined by pathology findings. 4.  You will receive a letter within 1-2 weeks with the results of your biopsy as well as final recommendations.  Please call my office if you have not received a letter after 3 weeks.  eSigned:  Jerene Bears, MD 03/10/2015 4:33 PM   cc: Nicholos Johns MD and The Patient   PATIENT NAME:  Rodell, Marrs MR#: [id number]

## 2015-03-10 NOTE — Progress Notes (Signed)
Called to room to assist during endoscopic procedure.  Patient ID and intended procedure confirmed with present staff. Received instructions for my participation in the procedure from the performing physician.  

## 2015-03-10 NOTE — Patient Instructions (Signed)
YOU HAD AN ENDOSCOPIC PROCEDURE TODAY AT [facility]:   Refer to the procedure report that was given to you for any specific questions about what was found during the examination.  If the procedure report does not answer your questions, please call your gastroenterologist to clarify.  If you requested that your care partner not be given the details of your procedure findings, then the procedure report has been included in a sealed envelope for you to review at your convenience later.  YOU SHOULD EXPECT: Some feelings of bloating in the abdomen. Passage of more gas than usual.  Walking can help get rid of the air that was put into your GI tract during the procedure and reduce the bloating. If you had a lower endoscopy (such as a colonoscopy or flexible sigmoidoscopy) you may notice spotting of blood in your stool or on the toilet paper. If you underwent a bowel prep for your procedure, you may not have a normal bowel movement for a few days.  Please Note:  You might notice some irritation and congestion in your nose or some drainage.  This is from the oxygen used during your procedure.  There is no need for concern and it should clear up in a day or so.  SYMPTOMS TO REPORT IMMEDIATELY:   Following lower endoscopy (colonoscopy or flexible sigmoidoscopy):  Excessive amounts of blood in the stool  Significant tenderness or worsening of abdominal pains  Swelling of the abdomen that is new, acute  Fever of 100F or higher  For urgent or emergent issues, a gastroenterologist can be reached at any hour by calling [phone].   DIET: Your first meal following the procedure should be a small meal and then it is ok to progress to your normal diet. Heavy or fried foods are harder to digest and may make you feel nauseous or bloated.  Likewise, meals heavy in dairy and vegetables can increase bloating.  Drink plenty of fluids but you should avoid alcoholic beverages for 24  hours.  ACTIVITY:  You should plan to take it easy for the rest of today and you should NOT DRIVE or use heavy machinery until tomorrow (because of the sedation medicines used during the test).    FOLLOW UP: Our staff will call the number listed on your records the next business day following your procedure to check on you and address any questions or concerns that you may have regarding the information given to you following your procedure. If we do not reach you, we will leave a message.  However, if you are feeling well and you are not experiencing any problems, there is no need to return our call.  We will assume that you have returned to your regular daily activities without incident.  If any biopsies were taken you will be contacted by phone or by letter within the next 1-3 weeks.  Please call us at [phone] if you have not heard about the biopsies in 3 weeks.    SIGNATURES/CONFIDENTIALITY: You and/or your care partner have signed paperwork which will be entered into your electronic medical record.  These signatures attest to the fact that that the information above on your After Visit Summary has been reviewed and is understood.  Full responsibility of the confidentiality of this discharge information lies with you and/or your care-partner.  Polyp and diverticulosis information given.   Hold all nonsteroidal anti-inflammatory medication and aspirin for 2 weeks.

## 2015-03-10 NOTE — Progress Notes (Signed)
Stable to RR 

## 2015-03-13 ENCOUNTER — Telehealth: Payer: Self-pay | Admitting: *Deleted

## 2015-03-13 NOTE — Telephone Encounter (Signed)
Left message on f/u callback 

## 2015-03-14 ENCOUNTER — Encounter: Payer: Self-pay | Admitting: Internal Medicine

## 2015-03-14 DIAGNOSIS — C187 Malignant neoplasm of sigmoid colon: Secondary | ICD-10-CM | POA: Insufficient documentation

## 2015-03-28 ENCOUNTER — Other Ambulatory Visit: Payer: Self-pay | Admitting: *Deleted

## 2015-07-26 ENCOUNTER — Encounter: Payer: Self-pay | Admitting: Internal Medicine

## 2015-09-14 ENCOUNTER — Ambulatory Visit (AMBULATORY_SURGERY_CENTER): Payer: Self-pay

## 2015-09-14 VITALS — Ht 70.0 in | Wt 228.8 lb

## 2015-09-14 DIAGNOSIS — Z860100 Personal history of colon polyps, unspecified: Secondary | ICD-10-CM

## 2015-09-14 MED ORDER — SUPREP BOWEL PREP KIT 17.5-3.13-1.6 GM/177ML PO SOLN: 1.0000 | Freq: Once | ORAL | Status: DC

## 2015-09-14 NOTE — Progress Notes (Signed)
No allergies to eggs or soy No diet/weight loss meds No home oxygen No past problems with anesthesia  Has email and internet; refused emmi 

## 2015-09-19 ENCOUNTER — Telehealth: Payer: Self-pay | Admitting: Internal Medicine

## 2015-09-19 NOTE — Telephone Encounter (Signed)
Left message for pt to call back  °

## 2015-09-19 NOTE — Telephone Encounter (Signed)
Pt states he takes meloxicam for back discomfort at night and wanted to know if it was ok for him to take it the night prior to his procedure. Let pt know that is fine for him to take.

## 2015-09-21 ENCOUNTER — Encounter: Payer: Self-pay | Admitting: Internal Medicine

## 2015-09-21 ENCOUNTER — Ambulatory Visit (AMBULATORY_SURGERY_CENTER): Payer: BLUE CROSS/BLUE SHIELD | Admitting: Internal Medicine

## 2015-09-21 VITALS — BP 121/82 | HR 83 | Temp 98.1°F | Resp 17 | Ht 70.0 in | Wt 228.0 lb

## 2015-09-21 DIAGNOSIS — K529 Noninfective gastroenteritis and colitis, unspecified: Secondary | ICD-10-CM | POA: Diagnosis not present

## 2015-09-21 DIAGNOSIS — K633 Ulcer of intestine: Secondary | ICD-10-CM | POA: Diagnosis not present

## 2015-09-21 DIAGNOSIS — Z85038 Personal history of other malignant neoplasm of large intestine: Secondary | ICD-10-CM | POA: Diagnosis not present

## 2015-09-21 DIAGNOSIS — Z86010 Personal history of colonic polyps: Secondary | ICD-10-CM | POA: Diagnosis not present

## 2015-09-21 MED ORDER — SODIUM CHLORIDE 0.9 % IV SOLN: 500.0000 mL | INTRAVENOUS | Status: DC

## 2015-09-21 NOTE — Op Note (Signed)
[place]  [employer] [address]. [place], [address]   COLONOSCOPY PROCEDURE REPORT  PATIENT: Troy, [name]  MR#: [id number] BIRTHDATE: 05/07/1951 , 65  yrs. old GENDER: male ENDOSCOPIST: Jerene Bears, MD PROCEDURE DATE:  09/21/2015 PROCEDURE:   Colonoscopy, surveillance and Colonoscopy with biopsy First Screening Colonoscopy - Avg.  risk and is 50 yrs.  old or older - No.  Prior Negative Screening - Now for repeat screening. N/A  History of Adenoma - Now for follow-up colonoscopy & has been > or = to 3 yrs.  No.  It has been less than 3 yrs since last colonoscopy.  Medical reason.  Polyps removed today? No Recommend repeat exam, <10 yrs? Yes high risk ASA CLASS:   Class II INDICATIONS:Surveillance due to prior colonic neoplasia (these multiple tubular adenomas), sigmoid colon cancer arising from pedunculated polyp endoscopically resected June 2016 MEDICATIONS: Monitored anesthesia care and Propofol 300 mg IV  DESCRIPTION OF PROCEDURE:   After the risks benefits and alternatives of the procedure were thoroughly explained, informed consent was obtained.  The digital rectal exam revealed no rectal mass.   The LB [id number]  endoscope was introduced through the anus and advanced to the terminal ileum which was intubated for a short distance. No adverse events experienced.   The quality of the prep was good.  (Suprep was used)  The instrument was then slowly withdrawn as the colon was fully examined. Estimated blood loss is zero unless otherwise noted in this procedure report.      COLON FINDINGS: The examined terminal ileum appeared to be normal. Non-bleeding ulceration was found at the ileocecal valve.  Biopsies were taken around the ulcerations, at the center and at edge of the ulcerations.   There was moderate diverticulosis noted in the transverse colon and left colon.   2 previously placed mucosal tattoos seen in the sigmoid colon at 30 cm.  Between  these tattoos an area of scarring was seen without evidence of residual polyp. Previously placed endoscopic clips also not seen.  No polyps seen today. Retroflexed views revealed internal hemorrhoids. The time to cecum = 2.1 Withdrawal time = 13.2   The scope was withdrawn and the procedure completed. COMPLICATIONS: There were no immediate complications.  ENDOSCOPIC IMPRESSION: 1.   The examined terminal ileum appeared to be normal 2.   Ulcerations found at the ileocecal valve; biopsies were taken 3.   Moderate diverticulosis was noted in the transverse colon and left colon 4.   2 previously placed mucosal tattoo seen in the sigmoid colon at 30 cm.  Between these tattoos an area of scarring was seen without evidence of residual polyp.  RECOMMENDATIONS: 1.  Await pathology results 2.  High fiber diet 3.  Timing of repeat colonoscopy will be determined by pathology findings, likely 2 years given history of colon cancer 6 months ago. 4.  You will receive a letter within 1-2 weeks with the results of your biopsy as well as final recommendations.  Please call my office if you have not received a letter after 3 weeks.  eSigned:  Jerene Bears, MD 09/21/2015 11:09 AM   cc:  the patient, PCP   PATIENT NAME:  [name], Troy MR#: [id number]

## 2015-09-21 NOTE — Patient Instructions (Signed)
YOU HAD AN ENDOSCOPIC PROCEDURE TODAY AT THE [facility]:   Refer to the procedure report that was given to you for any specific questions about what was found during the examination.  If the procedure report does not answer your questions, please call your gastroenterologist to clarify.  If you requested that your care partner not be given the details of your procedure findings, then the procedure report has been included in a sealed envelope for you to review at your convenience later.  YOU SHOULD EXPECT: Some feelings of bloating in the abdomen. Passage of more gas than usual.  Walking can help get rid of the air that was put into your GI tract during the procedure and reduce the bloating. If you had a lower endoscopy (such as a colonoscopy or flexible sigmoidoscopy) you may notice spotting of blood in your stool or on the toilet paper. If you underwent a bowel prep for your procedure, you may not have a normal bowel movement for a few days.  Please Note:  You might notice some irritation and congestion in your nose or some drainage.  This is from the oxygen used during your procedure.  There is no need for concern and it should clear up in a day or so.  SYMPTOMS TO REPORT IMMEDIATELY:   Following lower endoscopy (colonoscopy or flexible sigmoidoscopy):  Excessive amounts of blood in the stool  Significant tenderness or worsening of abdominal pains  Swelling of the abdomen that is new, acute  Fever of 100F or higher    For urgent or emergent issues, a gastroenterologist can be reached at any hour by calling [phone].   DIET: Your first meal following the procedure should be a small meal and then it is ok to progress to your normal diet. Heavy or fried foods are harder to digest and may make you feel nauseous or bloated.  Likewise, meals heavy in dairy and vegetables can increase bloating.  Drink plenty of fluids but you should avoid alcoholic beverages for 24  hours.  ACTIVITY:  You should plan to take it easy for the rest of today and you should NOT DRIVE or use heavy machinery until tomorrow (because of the sedation medicines used during the test).    FOLLOW UP: Our staff will call the number listed on your records the next business day following your procedure to check on you and address any questions or concerns that you may have regarding the information given to you following your procedure. If we do not reach you, we will leave a message.  However, if you are feeling well and you are not experiencing any problems, there is no need to return our call.  We will assume that you have returned to your regular daily activities without incident.  If any biopsies were taken you will be contacted by phone or by letter within the next 1-3 weeks.  Please call us at [phone] if you have not heard about the biopsies in 3 weeks.    SIGNATURES/CONFIDENTIALITY: You and/or your care partner have signed paperwork which will be entered into your electronic medical record.  These signatures attest to the fact that that the information above on your After Visit Summary has been reviewed and is understood.  Full responsibility of the confidentiality of this discharge information lies with you and/or your care-partner.   Resume medications. Information given on diverticulosis,hemorrhoids and high fiber diet. 

## 2015-09-21 NOTE — Progress Notes (Signed)
Stable to RR 

## 2015-09-21 NOTE — Progress Notes (Signed)
Called to room to assist during endoscopic procedure.  Patient ID and intended procedure confirmed with present staff. Received instructions for my participation in the procedure from the performing physician.  

## 2015-09-22 ENCOUNTER — Telehealth: Payer: Self-pay

## 2015-09-22 NOTE — Telephone Encounter (Signed)
Left message on answering machine. 

## 2015-09-26 ENCOUNTER — Encounter: Payer: Self-pay | Admitting: Internal Medicine

## 2017-10-21 ENCOUNTER — Encounter: Payer: Self-pay | Admitting: Internal Medicine

## 2017-10-29 DIAGNOSIS — I10 Essential (primary) hypertension: Secondary | ICD-10-CM | POA: Diagnosis not present

## 2017-10-29 DIAGNOSIS — Z6830 Body mass index (BMI) 30.0-30.9, adult: Secondary | ICD-10-CM | POA: Diagnosis not present

## 2017-10-29 DIAGNOSIS — Z0000 Encounter for general adult medical examination without abnormal findings: Secondary | ICD-10-CM | POA: Diagnosis not present

## 2017-10-29 DIAGNOSIS — N400 Enlarged prostate without lower urinary tract symptoms: Secondary | ICD-10-CM | POA: Diagnosis not present

## 2017-10-29 DIAGNOSIS — Z9181 History of falling: Secondary | ICD-10-CM | POA: Diagnosis not present

## 2017-10-29 DIAGNOSIS — E785 Hyperlipidemia, unspecified: Secondary | ICD-10-CM | POA: Diagnosis not present

## 2017-10-29 DIAGNOSIS — M545 Low back pain: Secondary | ICD-10-CM | POA: Diagnosis not present

## 2017-10-29 DIAGNOSIS — K219 Gastro-esophageal reflux disease without esophagitis: Secondary | ICD-10-CM | POA: Diagnosis not present

## 2017-12-04 DIAGNOSIS — K219 Gastro-esophageal reflux disease without esophagitis: Secondary | ICD-10-CM | POA: Diagnosis not present

## 2017-12-04 DIAGNOSIS — E785 Hyperlipidemia, unspecified: Secondary | ICD-10-CM | POA: Diagnosis not present

## 2017-12-04 DIAGNOSIS — E559 Vitamin D deficiency, unspecified: Secondary | ICD-10-CM | POA: Diagnosis not present

## 2017-12-04 DIAGNOSIS — N400 Enlarged prostate without lower urinary tract symptoms: Secondary | ICD-10-CM | POA: Diagnosis not present

## 2017-12-04 DIAGNOSIS — I10 Essential (primary) hypertension: Secondary | ICD-10-CM | POA: Diagnosis not present

## 2017-12-04 DIAGNOSIS — E669 Obesity, unspecified: Secondary | ICD-10-CM | POA: Diagnosis not present

## 2017-12-04 DIAGNOSIS — Z79899 Other long term (current) drug therapy: Secondary | ICD-10-CM | POA: Diagnosis not present

## 2017-12-04 DIAGNOSIS — J309 Allergic rhinitis, unspecified: Secondary | ICD-10-CM | POA: Diagnosis not present

## 2017-12-04 DIAGNOSIS — Z6831 Body mass index (BMI) 31.0-31.9, adult: Secondary | ICD-10-CM | POA: Diagnosis not present

## 2017-12-04 DIAGNOSIS — H6123 Impacted cerumen, bilateral: Secondary | ICD-10-CM | POA: Diagnosis not present

## 2018-03-18 DIAGNOSIS — N400 Enlarged prostate without lower urinary tract symptoms: Secondary | ICD-10-CM | POA: Diagnosis not present

## 2018-03-18 DIAGNOSIS — E669 Obesity, unspecified: Secondary | ICD-10-CM | POA: Diagnosis not present

## 2018-03-18 DIAGNOSIS — Z131 Encounter for screening for diabetes mellitus: Secondary | ICD-10-CM | POA: Diagnosis not present

## 2018-03-18 DIAGNOSIS — E785 Hyperlipidemia, unspecified: Secondary | ICD-10-CM | POA: Diagnosis not present

## 2018-03-18 DIAGNOSIS — J309 Allergic rhinitis, unspecified: Secondary | ICD-10-CM | POA: Diagnosis not present

## 2018-03-18 DIAGNOSIS — Z79899 Other long term (current) drug therapy: Secondary | ICD-10-CM | POA: Diagnosis not present

## 2018-03-18 DIAGNOSIS — K219 Gastro-esophageal reflux disease without esophagitis: Secondary | ICD-10-CM | POA: Diagnosis not present

## 2018-03-18 DIAGNOSIS — E559 Vitamin D deficiency, unspecified: Secondary | ICD-10-CM | POA: Diagnosis not present

## 2018-03-18 DIAGNOSIS — I10 Essential (primary) hypertension: Secondary | ICD-10-CM | POA: Diagnosis not present

## 2018-03-18 DIAGNOSIS — R0683 Snoring: Secondary | ICD-10-CM | POA: Diagnosis not present

## 2018-03-18 DIAGNOSIS — Z6832 Body mass index (BMI) 32.0-32.9, adult: Secondary | ICD-10-CM | POA: Diagnosis not present

## 2018-05-05 ENCOUNTER — Encounter: Payer: Self-pay | Admitting: Internal Medicine

## 2018-06-10 DIAGNOSIS — I10 Essential (primary) hypertension: Secondary | ICD-10-CM | POA: Diagnosis not present

## 2018-06-10 DIAGNOSIS — K219 Gastro-esophageal reflux disease without esophagitis: Secondary | ICD-10-CM | POA: Diagnosis not present

## 2018-06-10 DIAGNOSIS — E785 Hyperlipidemia, unspecified: Secondary | ICD-10-CM | POA: Diagnosis not present

## 2018-06-10 DIAGNOSIS — Z79899 Other long term (current) drug therapy: Secondary | ICD-10-CM | POA: Diagnosis not present

## 2018-06-10 DIAGNOSIS — M545 Low back pain: Secondary | ICD-10-CM | POA: Diagnosis not present

## 2018-06-10 DIAGNOSIS — R7303 Prediabetes: Secondary | ICD-10-CM | POA: Diagnosis not present

## 2018-06-10 DIAGNOSIS — E559 Vitamin D deficiency, unspecified: Secondary | ICD-10-CM | POA: Diagnosis not present

## 2018-06-10 DIAGNOSIS — Z6832 Body mass index (BMI) 32.0-32.9, adult: Secondary | ICD-10-CM | POA: Diagnosis not present

## 2018-06-10 DIAGNOSIS — Z1339 Encounter for screening examination for other mental health and behavioral disorders: Secondary | ICD-10-CM | POA: Diagnosis not present

## 2018-06-10 DIAGNOSIS — J309 Allergic rhinitis, unspecified: Secondary | ICD-10-CM | POA: Diagnosis not present

## 2018-07-03 ENCOUNTER — Encounter: Payer: Self-pay | Admitting: Internal Medicine

## 2018-07-03 ENCOUNTER — Ambulatory Visit (AMBULATORY_SURGERY_CENTER): Payer: Self-pay

## 2018-07-03 VITALS — Ht 70.0 in | Wt 229.4 lb

## 2018-07-03 DIAGNOSIS — Z86010 Personal history of colonic polyps: Secondary | ICD-10-CM

## 2018-07-03 MED ORDER — NA SULFATE-K SULFATE-MG SULF 17.5-3.13-1.6 GM/177ML PO SOLN: 1.0000 | Freq: Once | ORAL | 0 refills | Status: AC

## 2018-07-03 NOTE — Progress Notes (Signed)
Denies allergies to eggs or soy products. Denies complication of anesthesia or sedation. Denies use of weight loss medication. Denies use of O2.   Emmi instructions declined.  

## 2018-07-16 ENCOUNTER — Telehealth: Payer: Self-pay | Admitting: Internal Medicine

## 2018-07-16 NOTE — Telephone Encounter (Signed)
Patient called about chilling his suprep> called him back and told him he couldn't put the suprep bottles in the refrigerator but he could chill the cold water bottles or pour over ice. Patient verbalized understanding. Sm

## 2018-07-17 ENCOUNTER — Ambulatory Visit (AMBULATORY_SURGERY_CENTER): Payer: Medicare HMO | Admitting: Internal Medicine

## 2018-07-17 ENCOUNTER — Encounter: Payer: Self-pay | Admitting: Internal Medicine

## 2018-07-17 VITALS — BP 94/59 | HR 65 | Temp 97.8°F | Resp 13

## 2018-07-17 DIAGNOSIS — E669 Obesity, unspecified: Secondary | ICD-10-CM | POA: Diagnosis not present

## 2018-07-17 DIAGNOSIS — Z85038 Personal history of other malignant neoplasm of large intestine: Secondary | ICD-10-CM

## 2018-07-17 DIAGNOSIS — D125 Benign neoplasm of sigmoid colon: Secondary | ICD-10-CM | POA: Diagnosis not present

## 2018-07-17 DIAGNOSIS — K635 Polyp of colon: Secondary | ICD-10-CM

## 2018-07-17 DIAGNOSIS — I10 Essential (primary) hypertension: Secondary | ICD-10-CM | POA: Diagnosis not present

## 2018-07-17 MED ORDER — SODIUM CHLORIDE 0.9 % IV SOLN: 500.0000 mL | Freq: Once | INTRAVENOUS | Status: DC

## 2018-07-17 NOTE — Progress Notes (Signed)
To recovery, report to RN, VSS. 

## 2018-07-17 NOTE — Progress Notes (Signed)
Called to room to assist during endoscopic procedure.  Patient ID and intended procedure confirmed with present staff. Received instructions for my participation in the procedure from the performing physician.  

## 2018-07-17 NOTE — Op Note (Signed)
[place] Patient Name: Troy [name] Procedure Date: 07/17/2018 3:13 PM MRN: [id number] Endoscopist: Jerene Bears , MD Age: 67 Referring MD:  Date of Birth: Jul 06, 1951 Gender: Male Account #: [id number] Procedure:                Colonoscopy Indications:              High risk colon cancer surveillance: Personal                            history of colon cancer resected endoscopically in                            2016, Last colonoscopy: 2017 Medicines:                Monitored Anesthesia Care Procedure:                Pre-Anesthesia Assessment:                           - Prior to the procedure, a History and Physical                            was performed, and patient medications and                            allergies were reviewed. The patient's tolerance of                            previous anesthesia was also reviewed. The risks                            and benefits of the procedure and the sedation                            options and risks were discussed with the patient.                            All questions were answered, and informed consent                            was obtained. Prior Anticoagulants: The patient has                            taken no previous anticoagulant or antiplatelet                            agents. ASA Grade Assessment: II - A patient with                            mild systemic disease. After reviewing the risks                            and benefits, the patient was deemed in  satisfactory condition to undergo the procedure.                           After obtaining informed consent, the colonoscope                            was passed under direct vision. Throughout the                            procedure, the patient's blood pressure, pulse, and                            oxygen saturations were monitored continuously. The                            Model [id number]) scope was  introduced                            through the anus and advanced to the cecum,                            identified by appendiceal orifice and ileocecal                            valve. The colonoscopy was performed without                            difficulty. The patient tolerated the procedure                            well. The quality of the bowel preparation was                            good. The ileocecal valve, appendiceal orifice, and                            rectum were photographed. Scope In: 3:18:15 PM Scope Out: 3:32:25 PM Scope Withdrawal Time: 0 hours 9 minutes 42 seconds  Total Procedure Duration: 0 hours 14 minutes 10 seconds  Findings:                 The digital rectal exam was normal.                           A 3 mm polyp was found in the sigmoid colon. The                            polyp was sessile. The polyp was removed with a                            cold snare. Resection and retrieval were complete.                           A tattoo was seen in the sigmoid colon. A  post-polypectomy scar was found at the tattoo site.                            There was no evidence of residual polyp tissue.                           Multiple small and large-mouthed diverticula were                            found in the sigmoid colon and descending colon.                           Internal hemorrhoids were found during                            retroflexion. The hemorrhoids were medium-sized. Complications:            No immediate complications. Estimated Blood Loss:     Estimated blood loss was minimal. Impression:               - One 3 mm polyp in the sigmoid colon, removed with                            a cold snare. Resected and retrieved.                           - A tattoo was seen in the mid sigmoid colon. A                            post-polypectomy scar was found between the                            previously placed  tattoo sites. There was no                            evidence of residual polyp tissue.                           - Moderate diverticulosis in the sigmoid colon and                            in the descending colon.                           - Internal hemorrhoids. Recommendation:           - Patient has a contact number available for                            emergencies. The signs and symptoms of potential                            delayed complications were discussed with the  patient. Return to normal activities tomorrow.                            Written discharge instructions were provided to the                            patient.                           - Resume previous diet.                           - Continue present medications.                           - Await pathology results.                           - Repeat colonoscopy is recommended for                            surveillance, likely 3 years given history. The                            colonoscopy date will be determined after pathology                            results from today's exam become available for                            review. Jerene Bears, MD 07/17/2018 3:37:18 PM This report has been signed electronically.

## 2018-07-17 NOTE — Progress Notes (Signed)
Pt's states no medical or surgical changes since previsit or office visit. 

## 2018-07-17 NOTE — Patient Instructions (Signed)
YOU HAD AN ENDOSCOPIC PROCEDURE TODAY AT THE [facility]:   Refer to the procedure report that was given to you for any specific questions about what was found during the examination.  If the procedure report does not answer your questions, please call your gastroenterologist to clarify.  If you requested that your care partner not be given the details of your procedure findings, then the procedure report has been included in a sealed envelope for you to review at your convenience later.  YOU SHOULD EXPECT: Some feelings of bloating in the abdomen. Passage of more gas than usual.  Walking can help get rid of the air that was put into your GI tract during the procedure and reduce the bloating. If you had a lower endoscopy (such as a colonoscopy or flexible sigmoidoscopy) you may notice spotting of blood in your stool or on the toilet paper. If you underwent a bowel prep for your procedure, you may not have a normal bowel movement for a few days.  Please Note:  You might notice some irritation and congestion in your nose or some drainage.  This is from the oxygen used during your procedure.  There is no need for concern and it should clear up in a day or so.  SYMPTOMS TO REPORT IMMEDIATELY:   Following lower endoscopy (colonoscopy or flexible sigmoidoscopy):  Excessive amounts of blood in the stool  Significant tenderness or worsening of abdominal pains  Swelling of the abdomen that is new, acute  Fever of 100F or higher  For urgent or emergent issues, a gastroenterologist can be reached at any hour by calling [phone].   DIET:  We do recommend a small meal at first, but then you may proceed to your regular diet.  Drink plenty of fluids but you should avoid alcoholic beverages for 24 hours.  MEDICATIONS: Continue present medications.  Please see handouts given to you by your recovery nurse.  ACTIVITY:  You should plan to take it easy for the rest of today and you should NOT  DRIVE or use heavy machinery until tomorrow (because of the sedation medicines used during the test).    FOLLOW UP: Our staff will call the number listed on your records the next business day following your procedure to check on you and address any questions or concerns that you may have regarding the information given to you following your procedure. If we do not reach you, we will leave a message.  However, if you are feeling well and you are not experiencing any problems, there is no need to return our call.  We will assume that you have returned to your regular daily activities without incident.  If any biopsies were taken you will be contacted by phone or by letter within the next 1-3 weeks.  Please call us at [phone] if you have not heard about the biopsies in 3 weeks.   Thank you for allowing us to provide for your healthcare needs today.   SIGNATURES/CONFIDENTIALITY: You and/or your care partner have signed paperwork which will be entered into your electronic medical record.  These signatures attest to the fact that that the information above on your After Visit Summary has been reviewed and is understood.  Full responsibility of the confidentiality of this discharge information lies with you and/or your care-partner. 

## 2018-07-20 ENCOUNTER — Telehealth: Payer: Self-pay

## 2018-07-20 NOTE — Telephone Encounter (Signed)
  Follow up Call-  Call back number 07/17/2018  Post procedure Call Back phone  # [phone]  Permission to leave phone message Yes  Some recent data might be hidden     Patient questions:  Do you have a fever, pain , or abdominal swelling? No. Pain Score  0 *  Have you tolerated food without any problems? Yes.    Have you been able to return to your normal activities? Yes.    Do you have any questions about your discharge instructions: Diet   No. Medications  No. Follow up visit  No.  Do you have questions or concerns about your Care? No.  Actions: * If pain score is 4 or above: No action needed, pain <4.

## 2018-07-20 NOTE — Telephone Encounter (Signed)
  Follow up Call-  Call back number 07/17/2018  Post procedure Call Back phone  # [phone]  Permission to leave phone message Yes  Some recent data might be hidden     No ID on answering machine; no message left

## 2018-07-24 ENCOUNTER — Encounter: Payer: Self-pay | Admitting: Internal Medicine

## 2018-07-29 DIAGNOSIS — Z23 Encounter for immunization: Secondary | ICD-10-CM | POA: Diagnosis not present

## 2018-07-30 DIAGNOSIS — R69 Illness, unspecified: Secondary | ICD-10-CM | POA: Diagnosis not present

## 2018-08-27 DIAGNOSIS — H6690 Otitis media, unspecified, unspecified ear: Secondary | ICD-10-CM | POA: Diagnosis not present

## 2018-08-27 DIAGNOSIS — Z6832 Body mass index (BMI) 32.0-32.9, adult: Secondary | ICD-10-CM | POA: Diagnosis not present

## 2018-09-12 DIAGNOSIS — Z0100 Encounter for examination of eyes and vision without abnormal findings: Secondary | ICD-10-CM | POA: Diagnosis not present

## 2018-10-07 DIAGNOSIS — J309 Allergic rhinitis, unspecified: Secondary | ICD-10-CM | POA: Diagnosis not present

## 2018-10-07 DIAGNOSIS — I10 Essential (primary) hypertension: Secondary | ICD-10-CM | POA: Diagnosis not present

## 2018-10-07 DIAGNOSIS — K219 Gastro-esophageal reflux disease without esophagitis: Secondary | ICD-10-CM | POA: Diagnosis not present

## 2018-10-07 DIAGNOSIS — R7303 Prediabetes: Secondary | ICD-10-CM | POA: Diagnosis not present

## 2018-10-07 DIAGNOSIS — M545 Low back pain: Secondary | ICD-10-CM | POA: Diagnosis not present

## 2018-10-07 DIAGNOSIS — E559 Vitamin D deficiency, unspecified: Secondary | ICD-10-CM | POA: Diagnosis not present

## 2018-10-07 DIAGNOSIS — E785 Hyperlipidemia, unspecified: Secondary | ICD-10-CM | POA: Diagnosis not present

## 2018-10-07 DIAGNOSIS — Z23 Encounter for immunization: Secondary | ICD-10-CM | POA: Diagnosis not present

## 2018-10-07 DIAGNOSIS — Z6832 Body mass index (BMI) 32.0-32.9, adult: Secondary | ICD-10-CM | POA: Diagnosis not present

## 2018-10-07 DIAGNOSIS — N400 Enlarged prostate without lower urinary tract symptoms: Secondary | ICD-10-CM | POA: Diagnosis not present

## 2018-10-07 DIAGNOSIS — Z79899 Other long term (current) drug therapy: Secondary | ICD-10-CM | POA: Diagnosis not present

## 2018-10-07 DIAGNOSIS — Z1331 Encounter for screening for depression: Secondary | ICD-10-CM | POA: Diagnosis not present

## 2018-12-29 DIAGNOSIS — R0683 Snoring: Secondary | ICD-10-CM | POA: Diagnosis not present

## 2018-12-29 DIAGNOSIS — M545 Low back pain: Secondary | ICD-10-CM | POA: Diagnosis not present

## 2018-12-29 DIAGNOSIS — M79604 Pain in right leg: Secondary | ICD-10-CM | POA: Diagnosis not present

## 2018-12-29 DIAGNOSIS — I10 Essential (primary) hypertension: Secondary | ICD-10-CM | POA: Diagnosis not present

## 2018-12-29 DIAGNOSIS — M179 Osteoarthritis of knee, unspecified: Secondary | ICD-10-CM | POA: Diagnosis not present

## 2018-12-29 DIAGNOSIS — J309 Allergic rhinitis, unspecified: Secondary | ICD-10-CM | POA: Diagnosis not present

## 2018-12-29 DIAGNOSIS — E559 Vitamin D deficiency, unspecified: Secondary | ICD-10-CM | POA: Diagnosis not present

## 2018-12-29 DIAGNOSIS — K219 Gastro-esophageal reflux disease without esophagitis: Secondary | ICD-10-CM | POA: Diagnosis not present

## 2018-12-29 DIAGNOSIS — N400 Enlarged prostate without lower urinary tract symptoms: Secondary | ICD-10-CM | POA: Diagnosis not present

## 2018-12-29 DIAGNOSIS — E785 Hyperlipidemia, unspecified: Secondary | ICD-10-CM | POA: Diagnosis not present

## 2019-03-24 DIAGNOSIS — Z6831 Body mass index (BMI) 31.0-31.9, adult: Secondary | ICD-10-CM | POA: Diagnosis not present

## 2019-03-24 DIAGNOSIS — I10 Essential (primary) hypertension: Secondary | ICD-10-CM | POA: Diagnosis not present

## 2019-03-24 DIAGNOSIS — E669 Obesity, unspecified: Secondary | ICD-10-CM | POA: Diagnosis not present

## 2019-03-24 DIAGNOSIS — N400 Enlarged prostate without lower urinary tract symptoms: Secondary | ICD-10-CM | POA: Diagnosis not present

## 2019-03-24 DIAGNOSIS — K219 Gastro-esophageal reflux disease without esophagitis: Secondary | ICD-10-CM | POA: Diagnosis not present

## 2019-03-24 DIAGNOSIS — M545 Low back pain: Secondary | ICD-10-CM | POA: Diagnosis not present

## 2019-03-24 DIAGNOSIS — R0683 Snoring: Secondary | ICD-10-CM | POA: Diagnosis not present

## 2019-03-24 DIAGNOSIS — J309 Allergic rhinitis, unspecified: Secondary | ICD-10-CM | POA: Diagnosis not present

## 2019-03-24 DIAGNOSIS — E559 Vitamin D deficiency, unspecified: Secondary | ICD-10-CM | POA: Diagnosis not present

## 2019-03-24 DIAGNOSIS — E785 Hyperlipidemia, unspecified: Secondary | ICD-10-CM | POA: Diagnosis not present

## 2019-04-14 DIAGNOSIS — M79604 Pain in right leg: Secondary | ICD-10-CM | POA: Diagnosis not present

## 2019-04-14 DIAGNOSIS — M79605 Pain in left leg: Secondary | ICD-10-CM | POA: Diagnosis not present

## 2019-04-14 DIAGNOSIS — E559 Vitamin D deficiency, unspecified: Secondary | ICD-10-CM | POA: Diagnosis not present

## 2019-04-14 DIAGNOSIS — E785 Hyperlipidemia, unspecified: Secondary | ICD-10-CM | POA: Diagnosis not present

## 2019-04-14 DIAGNOSIS — R739 Hyperglycemia, unspecified: Secondary | ICD-10-CM | POA: Diagnosis not present

## 2019-04-14 DIAGNOSIS — Z79899 Other long term (current) drug therapy: Secondary | ICD-10-CM | POA: Diagnosis not present

## 2019-04-14 DIAGNOSIS — Z6831 Body mass index (BMI) 31.0-31.9, adult: Secondary | ICD-10-CM | POA: Diagnosis not present

## 2019-05-14 DIAGNOSIS — G4733 Obstructive sleep apnea (adult) (pediatric): Secondary | ICD-10-CM | POA: Diagnosis not present

## 2019-05-28 DIAGNOSIS — Z0000 Encounter for general adult medical examination without abnormal findings: Secondary | ICD-10-CM | POA: Diagnosis not present

## 2019-05-28 DIAGNOSIS — Z1331 Encounter for screening for depression: Secondary | ICD-10-CM | POA: Diagnosis not present

## 2019-05-28 DIAGNOSIS — Z6831 Body mass index (BMI) 31.0-31.9, adult: Secondary | ICD-10-CM | POA: Diagnosis not present

## 2019-05-28 DIAGNOSIS — E785 Hyperlipidemia, unspecified: Secondary | ICD-10-CM | POA: Diagnosis not present

## 2019-05-28 DIAGNOSIS — Z9181 History of falling: Secondary | ICD-10-CM | POA: Diagnosis not present

## 2019-05-28 DIAGNOSIS — Z125 Encounter for screening for malignant neoplasm of prostate: Secondary | ICD-10-CM | POA: Diagnosis not present

## 2019-06-03 DIAGNOSIS — G4733 Obstructive sleep apnea (adult) (pediatric): Secondary | ICD-10-CM | POA: Diagnosis not present

## 2019-06-24 DIAGNOSIS — J309 Allergic rhinitis, unspecified: Secondary | ICD-10-CM | POA: Diagnosis not present

## 2019-06-24 DIAGNOSIS — N400 Enlarged prostate without lower urinary tract symptoms: Secondary | ICD-10-CM | POA: Diagnosis not present

## 2019-06-24 DIAGNOSIS — E559 Vitamin D deficiency, unspecified: Secondary | ICD-10-CM | POA: Diagnosis not present

## 2019-06-24 DIAGNOSIS — I10 Essential (primary) hypertension: Secondary | ICD-10-CM | POA: Diagnosis not present

## 2019-06-24 DIAGNOSIS — E785 Hyperlipidemia, unspecified: Secondary | ICD-10-CM | POA: Diagnosis not present

## 2019-06-24 DIAGNOSIS — R0683 Snoring: Secondary | ICD-10-CM | POA: Diagnosis not present

## 2019-06-24 DIAGNOSIS — E669 Obesity, unspecified: Secondary | ICD-10-CM | POA: Diagnosis not present

## 2019-06-24 DIAGNOSIS — K219 Gastro-esophageal reflux disease without esophagitis: Secondary | ICD-10-CM | POA: Diagnosis not present

## 2019-06-24 DIAGNOSIS — Z6831 Body mass index (BMI) 31.0-31.9, adult: Secondary | ICD-10-CM | POA: Diagnosis not present

## 2019-06-24 DIAGNOSIS — G4730 Sleep apnea, unspecified: Secondary | ICD-10-CM | POA: Diagnosis not present

## 2019-07-01 DIAGNOSIS — G4733 Obstructive sleep apnea (adult) (pediatric): Secondary | ICD-10-CM | POA: Diagnosis not present

## 2019-07-03 DIAGNOSIS — G4733 Obstructive sleep apnea (adult) (pediatric): Secondary | ICD-10-CM | POA: Diagnosis not present

## 2019-07-05 DIAGNOSIS — Z23 Encounter for immunization: Secondary | ICD-10-CM | POA: Diagnosis not present

## 2019-07-19 DIAGNOSIS — G4730 Sleep apnea, unspecified: Secondary | ICD-10-CM | POA: Diagnosis not present

## 2019-08-02 DIAGNOSIS — G4733 Obstructive sleep apnea (adult) (pediatric): Secondary | ICD-10-CM | POA: Diagnosis not present

## 2019-08-03 DIAGNOSIS — G4733 Obstructive sleep apnea (adult) (pediatric): Secondary | ICD-10-CM | POA: Diagnosis not present

## 2019-08-26 DIAGNOSIS — R37 Sexual dysfunction, unspecified: Secondary | ICD-10-CM | POA: Diagnosis not present

## 2019-08-26 DIAGNOSIS — Z566 Other physical and mental strain related to work: Secondary | ICD-10-CM | POA: Diagnosis not present

## 2019-08-26 DIAGNOSIS — Z6831 Body mass index (BMI) 31.0-31.9, adult: Secondary | ICD-10-CM | POA: Diagnosis not present

## 2019-08-26 DIAGNOSIS — R69 Illness, unspecified: Secondary | ICD-10-CM | POA: Diagnosis not present

## 2019-09-02 DIAGNOSIS — G4733 Obstructive sleep apnea (adult) (pediatric): Secondary | ICD-10-CM | POA: Diagnosis not present

## 2019-09-03 DIAGNOSIS — G4733 Obstructive sleep apnea (adult) (pediatric): Secondary | ICD-10-CM | POA: Diagnosis not present

## 2019-09-29 DIAGNOSIS — E559 Vitamin D deficiency, unspecified: Secondary | ICD-10-CM | POA: Diagnosis not present

## 2019-09-29 DIAGNOSIS — I10 Essential (primary) hypertension: Secondary | ICD-10-CM | POA: Diagnosis not present

## 2019-09-29 DIAGNOSIS — M79604 Pain in right leg: Secondary | ICD-10-CM | POA: Diagnosis not present

## 2019-09-29 DIAGNOSIS — K219 Gastro-esophageal reflux disease without esophagitis: Secondary | ICD-10-CM | POA: Diagnosis not present

## 2019-09-29 DIAGNOSIS — M545 Low back pain: Secondary | ICD-10-CM | POA: Diagnosis not present

## 2019-09-29 DIAGNOSIS — N400 Enlarged prostate without lower urinary tract symptoms: Secondary | ICD-10-CM | POA: Diagnosis not present

## 2019-09-29 DIAGNOSIS — Z6831 Body mass index (BMI) 31.0-31.9, adult: Secondary | ICD-10-CM | POA: Diagnosis not present

## 2019-09-29 DIAGNOSIS — J309 Allergic rhinitis, unspecified: Secondary | ICD-10-CM | POA: Diagnosis not present

## 2019-09-29 DIAGNOSIS — E785 Hyperlipidemia, unspecified: Secondary | ICD-10-CM | POA: Diagnosis not present

## 2019-09-29 DIAGNOSIS — E669 Obesity, unspecified: Secondary | ICD-10-CM | POA: Diagnosis not present

## 2019-10-03 DIAGNOSIS — G4733 Obstructive sleep apnea (adult) (pediatric): Secondary | ICD-10-CM | POA: Diagnosis not present

## 2019-10-05 DIAGNOSIS — G4733 Obstructive sleep apnea (adult) (pediatric): Secondary | ICD-10-CM | POA: Diagnosis not present

## 2019-10-08 DIAGNOSIS — E785 Hyperlipidemia, unspecified: Secondary | ICD-10-CM | POA: Diagnosis not present

## 2019-10-08 DIAGNOSIS — Z79899 Other long term (current) drug therapy: Secondary | ICD-10-CM | POA: Diagnosis not present

## 2019-10-08 DIAGNOSIS — E559 Vitamin D deficiency, unspecified: Secondary | ICD-10-CM | POA: Diagnosis not present

## 2019-10-08 DIAGNOSIS — Z131 Encounter for screening for diabetes mellitus: Secondary | ICD-10-CM | POA: Diagnosis not present

## 2019-11-03 DIAGNOSIS — G4733 Obstructive sleep apnea (adult) (pediatric): Secondary | ICD-10-CM | POA: Diagnosis not present

## 2019-11-05 DIAGNOSIS — G4733 Obstructive sleep apnea (adult) (pediatric): Secondary | ICD-10-CM | POA: Diagnosis not present

## 2019-11-24 DIAGNOSIS — L918 Other hypertrophic disorders of the skin: Secondary | ICD-10-CM | POA: Diagnosis not present

## 2019-11-24 DIAGNOSIS — L820 Inflamed seborrheic keratosis: Secondary | ICD-10-CM | POA: Diagnosis not present

## 2019-11-24 DIAGNOSIS — L578 Other skin changes due to chronic exposure to nonionizing radiation: Secondary | ICD-10-CM | POA: Diagnosis not present

## 2019-12-01 DIAGNOSIS — G4733 Obstructive sleep apnea (adult) (pediatric): Secondary | ICD-10-CM | POA: Diagnosis not present

## 2019-12-08 DIAGNOSIS — G4733 Obstructive sleep apnea (adult) (pediatric): Secondary | ICD-10-CM | POA: Diagnosis not present

## 2019-12-30 DIAGNOSIS — E785 Hyperlipidemia, unspecified: Secondary | ICD-10-CM | POA: Diagnosis not present

## 2019-12-30 DIAGNOSIS — E669 Obesity, unspecified: Secondary | ICD-10-CM | POA: Diagnosis not present

## 2019-12-30 DIAGNOSIS — Z6831 Body mass index (BMI) 31.0-31.9, adult: Secondary | ICD-10-CM | POA: Diagnosis not present

## 2019-12-30 DIAGNOSIS — M79604 Pain in right leg: Secondary | ICD-10-CM | POA: Diagnosis not present

## 2019-12-30 DIAGNOSIS — N400 Enlarged prostate without lower urinary tract symptoms: Secondary | ICD-10-CM | POA: Diagnosis not present

## 2019-12-30 DIAGNOSIS — J309 Allergic rhinitis, unspecified: Secondary | ICD-10-CM | POA: Diagnosis not present

## 2019-12-30 DIAGNOSIS — E559 Vitamin D deficiency, unspecified: Secondary | ICD-10-CM | POA: Diagnosis not present

## 2019-12-30 DIAGNOSIS — I10 Essential (primary) hypertension: Secondary | ICD-10-CM | POA: Diagnosis not present

## 2019-12-30 DIAGNOSIS — M545 Low back pain: Secondary | ICD-10-CM | POA: Diagnosis not present

## 2019-12-30 DIAGNOSIS — K219 Gastro-esophageal reflux disease without esophagitis: Secondary | ICD-10-CM | POA: Diagnosis not present

## 2020-01-01 DIAGNOSIS — G4733 Obstructive sleep apnea (adult) (pediatric): Secondary | ICD-10-CM | POA: Diagnosis not present

## 2020-01-10 DIAGNOSIS — G4733 Obstructive sleep apnea (adult) (pediatric): Secondary | ICD-10-CM | POA: Diagnosis not present

## 2020-01-31 DIAGNOSIS — G4733 Obstructive sleep apnea (adult) (pediatric): Secondary | ICD-10-CM | POA: Diagnosis not present

## 2020-02-12 DIAGNOSIS — G4733 Obstructive sleep apnea (adult) (pediatric): Secondary | ICD-10-CM | POA: Diagnosis not present

## 2020-03-02 DIAGNOSIS — G4733 Obstructive sleep apnea (adult) (pediatric): Secondary | ICD-10-CM | POA: Diagnosis not present

## 2020-03-22 DIAGNOSIS — G4733 Obstructive sleep apnea (adult) (pediatric): Secondary | ICD-10-CM | POA: Diagnosis not present

## 2020-03-30 DIAGNOSIS — E669 Obesity, unspecified: Secondary | ICD-10-CM | POA: Diagnosis not present

## 2020-03-30 DIAGNOSIS — K219 Gastro-esophageal reflux disease without esophagitis: Secondary | ICD-10-CM | POA: Diagnosis not present

## 2020-03-30 DIAGNOSIS — Z6831 Body mass index (BMI) 31.0-31.9, adult: Secondary | ICD-10-CM | POA: Diagnosis not present

## 2020-03-30 DIAGNOSIS — I10 Essential (primary) hypertension: Secondary | ICD-10-CM | POA: Diagnosis not present

## 2020-03-30 DIAGNOSIS — M79604 Pain in right leg: Secondary | ICD-10-CM | POA: Diagnosis not present

## 2020-03-30 DIAGNOSIS — E559 Vitamin D deficiency, unspecified: Secondary | ICD-10-CM | POA: Diagnosis not present

## 2020-03-30 DIAGNOSIS — N400 Enlarged prostate without lower urinary tract symptoms: Secondary | ICD-10-CM | POA: Diagnosis not present

## 2020-03-30 DIAGNOSIS — J309 Allergic rhinitis, unspecified: Secondary | ICD-10-CM | POA: Diagnosis not present

## 2020-03-30 DIAGNOSIS — E785 Hyperlipidemia, unspecified: Secondary | ICD-10-CM | POA: Diagnosis not present

## 2020-03-30 DIAGNOSIS — Z79899 Other long term (current) drug therapy: Secondary | ICD-10-CM | POA: Diagnosis not present

## 2020-04-01 DIAGNOSIS — G4733 Obstructive sleep apnea (adult) (pediatric): Secondary | ICD-10-CM | POA: Diagnosis not present

## 2020-04-12 DIAGNOSIS — E559 Vitamin D deficiency, unspecified: Secondary | ICD-10-CM | POA: Diagnosis not present

## 2020-04-12 DIAGNOSIS — R7303 Prediabetes: Secondary | ICD-10-CM | POA: Diagnosis not present

## 2020-04-12 DIAGNOSIS — Z79899 Other long term (current) drug therapy: Secondary | ICD-10-CM | POA: Diagnosis not present

## 2020-04-26 DIAGNOSIS — G4733 Obstructive sleep apnea (adult) (pediatric): Secondary | ICD-10-CM | POA: Diagnosis not present

## 2020-05-02 DIAGNOSIS — G4733 Obstructive sleep apnea (adult) (pediatric): Secondary | ICD-10-CM | POA: Diagnosis not present

## 2020-05-29 DIAGNOSIS — G4733 Obstructive sleep apnea (adult) (pediatric): Secondary | ICD-10-CM | POA: Diagnosis not present

## 2020-06-02 DIAGNOSIS — G4733 Obstructive sleep apnea (adult) (pediatric): Secondary | ICD-10-CM | POA: Diagnosis not present

## 2020-06-21 DIAGNOSIS — E669 Obesity, unspecified: Secondary | ICD-10-CM | POA: Diagnosis not present

## 2020-06-21 DIAGNOSIS — M79605 Pain in left leg: Secondary | ICD-10-CM | POA: Diagnosis not present

## 2020-06-21 DIAGNOSIS — J309 Allergic rhinitis, unspecified: Secondary | ICD-10-CM | POA: Diagnosis not present

## 2020-06-21 DIAGNOSIS — N400 Enlarged prostate without lower urinary tract symptoms: Secondary | ICD-10-CM | POA: Diagnosis not present

## 2020-06-21 DIAGNOSIS — K219 Gastro-esophageal reflux disease without esophagitis: Secondary | ICD-10-CM | POA: Diagnosis not present

## 2020-06-21 DIAGNOSIS — Z6831 Body mass index (BMI) 31.0-31.9, adult: Secondary | ICD-10-CM | POA: Diagnosis not present

## 2020-06-21 DIAGNOSIS — M79604 Pain in right leg: Secondary | ICD-10-CM | POA: Diagnosis not present

## 2020-06-21 DIAGNOSIS — E785 Hyperlipidemia, unspecified: Secondary | ICD-10-CM | POA: Diagnosis not present

## 2020-06-21 DIAGNOSIS — E559 Vitamin D deficiency, unspecified: Secondary | ICD-10-CM | POA: Diagnosis not present

## 2020-06-21 DIAGNOSIS — I10 Essential (primary) hypertension: Secondary | ICD-10-CM | POA: Diagnosis not present

## 2020-07-02 DIAGNOSIS — G4733 Obstructive sleep apnea (adult) (pediatric): Secondary | ICD-10-CM | POA: Diagnosis not present

## 2020-08-01 DIAGNOSIS — G4733 Obstructive sleep apnea (adult) (pediatric): Secondary | ICD-10-CM | POA: Diagnosis not present

## 2020-08-02 DIAGNOSIS — G4733 Obstructive sleep apnea (adult) (pediatric): Secondary | ICD-10-CM | POA: Diagnosis not present

## 2020-09-01 DIAGNOSIS — G4733 Obstructive sleep apnea (adult) (pediatric): Secondary | ICD-10-CM | POA: Diagnosis not present

## 2020-09-20 DIAGNOSIS — E669 Obesity, unspecified: Secondary | ICD-10-CM | POA: Diagnosis not present

## 2020-09-20 DIAGNOSIS — E785 Hyperlipidemia, unspecified: Secondary | ICD-10-CM | POA: Diagnosis not present

## 2020-09-20 DIAGNOSIS — Z0000 Encounter for general adult medical examination without abnormal findings: Secondary | ICD-10-CM | POA: Diagnosis not present

## 2020-09-20 DIAGNOSIS — Z9181 History of falling: Secondary | ICD-10-CM | POA: Diagnosis not present

## 2020-09-20 DIAGNOSIS — Z139 Encounter for screening, unspecified: Secondary | ICD-10-CM | POA: Diagnosis not present

## 2020-09-20 DIAGNOSIS — Z1331 Encounter for screening for depression: Secondary | ICD-10-CM | POA: Diagnosis not present

## 2020-10-02 DIAGNOSIS — G4733 Obstructive sleep apnea (adult) (pediatric): Secondary | ICD-10-CM | POA: Diagnosis not present

## 2020-10-04 DIAGNOSIS — N400 Enlarged prostate without lower urinary tract symptoms: Secondary | ICD-10-CM | POA: Diagnosis not present

## 2020-10-04 DIAGNOSIS — K219 Gastro-esophageal reflux disease without esophagitis: Secondary | ICD-10-CM | POA: Diagnosis not present

## 2020-10-04 DIAGNOSIS — E785 Hyperlipidemia, unspecified: Secondary | ICD-10-CM | POA: Diagnosis not present

## 2020-10-04 DIAGNOSIS — M79605 Pain in left leg: Secondary | ICD-10-CM | POA: Diagnosis not present

## 2020-10-04 DIAGNOSIS — J309 Allergic rhinitis, unspecified: Secondary | ICD-10-CM | POA: Diagnosis not present

## 2020-10-04 DIAGNOSIS — Z6831 Body mass index (BMI) 31.0-31.9, adult: Secondary | ICD-10-CM | POA: Diagnosis not present

## 2020-10-04 DIAGNOSIS — E559 Vitamin D deficiency, unspecified: Secondary | ICD-10-CM | POA: Diagnosis not present

## 2020-10-04 DIAGNOSIS — M79604 Pain in right leg: Secondary | ICD-10-CM | POA: Diagnosis not present

## 2020-10-04 DIAGNOSIS — I10 Essential (primary) hypertension: Secondary | ICD-10-CM | POA: Diagnosis not present

## 2020-10-04 DIAGNOSIS — E669 Obesity, unspecified: Secondary | ICD-10-CM | POA: Diagnosis not present

## 2020-11-01 DIAGNOSIS — G4733 Obstructive sleep apnea (adult) (pediatric): Secondary | ICD-10-CM | POA: Diagnosis not present

## 2020-11-02 DIAGNOSIS — G4733 Obstructive sleep apnea (adult) (pediatric): Secondary | ICD-10-CM | POA: Diagnosis not present

## 2020-11-30 DIAGNOSIS — G4733 Obstructive sleep apnea (adult) (pediatric): Secondary | ICD-10-CM | POA: Diagnosis not present

## 2020-12-26 DIAGNOSIS — K219 Gastro-esophageal reflux disease without esophagitis: Secondary | ICD-10-CM | POA: Diagnosis not present

## 2020-12-26 DIAGNOSIS — M79604 Pain in right leg: Secondary | ICD-10-CM | POA: Diagnosis not present

## 2020-12-26 DIAGNOSIS — E669 Obesity, unspecified: Secondary | ICD-10-CM | POA: Diagnosis not present

## 2020-12-26 DIAGNOSIS — Z6831 Body mass index (BMI) 31.0-31.9, adult: Secondary | ICD-10-CM | POA: Diagnosis not present

## 2020-12-26 DIAGNOSIS — E559 Vitamin D deficiency, unspecified: Secondary | ICD-10-CM | POA: Diagnosis not present

## 2020-12-26 DIAGNOSIS — J309 Allergic rhinitis, unspecified: Secondary | ICD-10-CM | POA: Diagnosis not present

## 2020-12-26 DIAGNOSIS — E785 Hyperlipidemia, unspecified: Secondary | ICD-10-CM | POA: Diagnosis not present

## 2020-12-26 DIAGNOSIS — M79605 Pain in left leg: Secondary | ICD-10-CM | POA: Diagnosis not present

## 2020-12-26 DIAGNOSIS — N400 Enlarged prostate without lower urinary tract symptoms: Secondary | ICD-10-CM | POA: Diagnosis not present

## 2020-12-26 DIAGNOSIS — I10 Essential (primary) hypertension: Secondary | ICD-10-CM | POA: Diagnosis not present

## 2020-12-28 DIAGNOSIS — E559 Vitamin D deficiency, unspecified: Secondary | ICD-10-CM | POA: Diagnosis not present

## 2020-12-28 DIAGNOSIS — R7303 Prediabetes: Secondary | ICD-10-CM | POA: Diagnosis not present

## 2020-12-28 DIAGNOSIS — Z79899 Other long term (current) drug therapy: Secondary | ICD-10-CM | POA: Diagnosis not present

## 2020-12-31 DIAGNOSIS — G4733 Obstructive sleep apnea (adult) (pediatric): Secondary | ICD-10-CM | POA: Diagnosis not present

## 2021-01-22 DIAGNOSIS — G4733 Obstructive sleep apnea (adult) (pediatric): Secondary | ICD-10-CM | POA: Diagnosis not present

## 2021-01-26 DIAGNOSIS — H52229 Regular astigmatism, unspecified eye: Secondary | ICD-10-CM | POA: Diagnosis not present

## 2021-01-26 DIAGNOSIS — I10 Essential (primary) hypertension: Secondary | ICD-10-CM | POA: Diagnosis not present

## 2021-01-26 DIAGNOSIS — H2512 Age-related nuclear cataract, left eye: Secondary | ICD-10-CM | POA: Diagnosis not present

## 2021-01-30 DIAGNOSIS — G4733 Obstructive sleep apnea (adult) (pediatric): Secondary | ICD-10-CM | POA: Diagnosis not present

## 2021-02-21 DIAGNOSIS — G4733 Obstructive sleep apnea (adult) (pediatric): Secondary | ICD-10-CM | POA: Diagnosis not present

## 2021-03-02 DIAGNOSIS — G4733 Obstructive sleep apnea (adult) (pediatric): Secondary | ICD-10-CM | POA: Diagnosis not present

## 2021-03-22 DIAGNOSIS — G4733 Obstructive sleep apnea (adult) (pediatric): Secondary | ICD-10-CM | POA: Diagnosis not present

## 2021-03-28 DIAGNOSIS — M79605 Pain in left leg: Secondary | ICD-10-CM | POA: Diagnosis not present

## 2021-03-28 DIAGNOSIS — K219 Gastro-esophageal reflux disease without esophagitis: Secondary | ICD-10-CM | POA: Diagnosis not present

## 2021-03-28 DIAGNOSIS — E559 Vitamin D deficiency, unspecified: Secondary | ICD-10-CM | POA: Diagnosis not present

## 2021-03-28 DIAGNOSIS — N400 Enlarged prostate without lower urinary tract symptoms: Secondary | ICD-10-CM | POA: Diagnosis not present

## 2021-03-28 DIAGNOSIS — M545 Low back pain, unspecified: Secondary | ICD-10-CM | POA: Diagnosis not present

## 2021-03-28 DIAGNOSIS — E669 Obesity, unspecified: Secondary | ICD-10-CM | POA: Diagnosis not present

## 2021-03-28 DIAGNOSIS — J309 Allergic rhinitis, unspecified: Secondary | ICD-10-CM | POA: Diagnosis not present

## 2021-03-28 DIAGNOSIS — E785 Hyperlipidemia, unspecified: Secondary | ICD-10-CM | POA: Diagnosis not present

## 2021-03-28 DIAGNOSIS — Z6831 Body mass index (BMI) 31.0-31.9, adult: Secondary | ICD-10-CM | POA: Diagnosis not present

## 2021-03-28 DIAGNOSIS — M79604 Pain in right leg: Secondary | ICD-10-CM | POA: Diagnosis not present

## 2021-04-01 DIAGNOSIS — G4733 Obstructive sleep apnea (adult) (pediatric): Secondary | ICD-10-CM | POA: Diagnosis not present

## 2021-04-12 DIAGNOSIS — E669 Obesity, unspecified: Secondary | ICD-10-CM | POA: Diagnosis not present

## 2021-04-12 DIAGNOSIS — Z6831 Body mass index (BMI) 31.0-31.9, adult: Secondary | ICD-10-CM | POA: Diagnosis not present

## 2021-04-12 DIAGNOSIS — K219 Gastro-esophageal reflux disease without esophagitis: Secondary | ICD-10-CM | POA: Diagnosis not present

## 2021-04-12 DIAGNOSIS — M7072 Other bursitis of hip, left hip: Secondary | ICD-10-CM | POA: Diagnosis not present

## 2021-04-12 DIAGNOSIS — M545 Low back pain, unspecified: Secondary | ICD-10-CM | POA: Diagnosis not present

## 2021-05-02 DIAGNOSIS — G4733 Obstructive sleep apnea (adult) (pediatric): Secondary | ICD-10-CM | POA: Diagnosis not present

## 2021-05-31 DIAGNOSIS — R69 Illness, unspecified: Secondary | ICD-10-CM | POA: Diagnosis not present

## 2021-06-02 DIAGNOSIS — G4733 Obstructive sleep apnea (adult) (pediatric): Secondary | ICD-10-CM | POA: Diagnosis not present

## 2021-06-20 DIAGNOSIS — G4733 Obstructive sleep apnea (adult) (pediatric): Secondary | ICD-10-CM | POA: Diagnosis not present

## 2021-06-27 DIAGNOSIS — M79605 Pain in left leg: Secondary | ICD-10-CM | POA: Diagnosis not present

## 2021-06-27 DIAGNOSIS — K219 Gastro-esophageal reflux disease without esophagitis: Secondary | ICD-10-CM | POA: Diagnosis not present

## 2021-06-27 DIAGNOSIS — N400 Enlarged prostate without lower urinary tract symptoms: Secondary | ICD-10-CM | POA: Diagnosis not present

## 2021-06-27 DIAGNOSIS — E785 Hyperlipidemia, unspecified: Secondary | ICD-10-CM | POA: Diagnosis not present

## 2021-06-27 DIAGNOSIS — M545 Low back pain, unspecified: Secondary | ICD-10-CM | POA: Diagnosis not present

## 2021-06-27 DIAGNOSIS — M179 Osteoarthritis of knee, unspecified: Secondary | ICD-10-CM | POA: Diagnosis not present

## 2021-06-27 DIAGNOSIS — I10 Essential (primary) hypertension: Secondary | ICD-10-CM | POA: Diagnosis not present

## 2021-06-27 DIAGNOSIS — E559 Vitamin D deficiency, unspecified: Secondary | ICD-10-CM | POA: Diagnosis not present

## 2021-06-27 DIAGNOSIS — Z79899 Other long term (current) drug therapy: Secondary | ICD-10-CM | POA: Diagnosis not present

## 2021-06-27 DIAGNOSIS — M79604 Pain in right leg: Secondary | ICD-10-CM | POA: Diagnosis not present

## 2021-07-02 DIAGNOSIS — G4733 Obstructive sleep apnea (adult) (pediatric): Secondary | ICD-10-CM | POA: Diagnosis not present

## 2021-07-02 DIAGNOSIS — Z79899 Other long term (current) drug therapy: Secondary | ICD-10-CM | POA: Diagnosis not present

## 2021-07-02 DIAGNOSIS — E559 Vitamin D deficiency, unspecified: Secondary | ICD-10-CM | POA: Diagnosis not present

## 2021-07-02 DIAGNOSIS — R7303 Prediabetes: Secondary | ICD-10-CM | POA: Diagnosis not present

## 2021-08-02 DIAGNOSIS — G4733 Obstructive sleep apnea (adult) (pediatric): Secondary | ICD-10-CM | POA: Diagnosis not present

## 2021-08-24 DIAGNOSIS — M545 Low back pain, unspecified: Secondary | ICD-10-CM | POA: Diagnosis not present

## 2021-08-24 DIAGNOSIS — E669 Obesity, unspecified: Secondary | ICD-10-CM | POA: Diagnosis not present

## 2021-08-24 DIAGNOSIS — Z6831 Body mass index (BMI) 31.0-31.9, adult: Secondary | ICD-10-CM | POA: Diagnosis not present

## 2021-08-24 DIAGNOSIS — K0510 Chronic gingivitis, plaque induced: Secondary | ICD-10-CM | POA: Diagnosis not present

## 2021-09-01 DIAGNOSIS — G4733 Obstructive sleep apnea (adult) (pediatric): Secondary | ICD-10-CM | POA: Diagnosis not present

## 2021-09-21 DIAGNOSIS — Z9181 History of falling: Secondary | ICD-10-CM | POA: Diagnosis not present

## 2021-09-21 DIAGNOSIS — E785 Hyperlipidemia, unspecified: Secondary | ICD-10-CM | POA: Diagnosis not present

## 2021-09-21 DIAGNOSIS — Z6832 Body mass index (BMI) 32.0-32.9, adult: Secondary | ICD-10-CM | POA: Diagnosis not present

## 2021-09-21 DIAGNOSIS — E669 Obesity, unspecified: Secondary | ICD-10-CM | POA: Diagnosis not present

## 2021-09-21 DIAGNOSIS — Z1331 Encounter for screening for depression: Secondary | ICD-10-CM | POA: Diagnosis not present

## 2021-09-21 DIAGNOSIS — Z0000 Encounter for general adult medical examination without abnormal findings: Secondary | ICD-10-CM | POA: Diagnosis not present

## 2021-09-24 DIAGNOSIS — G4733 Obstructive sleep apnea (adult) (pediatric): Secondary | ICD-10-CM | POA: Diagnosis not present

## 2021-10-02 DIAGNOSIS — G4733 Obstructive sleep apnea (adult) (pediatric): Secondary | ICD-10-CM | POA: Diagnosis not present

## 2021-10-10 DIAGNOSIS — M9902 Segmental and somatic dysfunction of thoracic region: Secondary | ICD-10-CM | POA: Diagnosis not present

## 2021-10-10 DIAGNOSIS — M9905 Segmental and somatic dysfunction of pelvic region: Secondary | ICD-10-CM | POA: Diagnosis not present

## 2021-10-10 DIAGNOSIS — M461 Sacroiliitis, not elsewhere classified: Secondary | ICD-10-CM | POA: Diagnosis not present

## 2021-10-10 DIAGNOSIS — M4316 Spondylolisthesis, lumbar region: Secondary | ICD-10-CM | POA: Diagnosis not present

## 2021-10-10 DIAGNOSIS — M9903 Segmental and somatic dysfunction of lumbar region: Secondary | ICD-10-CM | POA: Diagnosis not present

## 2021-10-24 DIAGNOSIS — M4316 Spondylolisthesis, lumbar region: Secondary | ICD-10-CM | POA: Diagnosis not present

## 2021-10-24 DIAGNOSIS — M9902 Segmental and somatic dysfunction of thoracic region: Secondary | ICD-10-CM | POA: Diagnosis not present

## 2021-10-24 DIAGNOSIS — M9903 Segmental and somatic dysfunction of lumbar region: Secondary | ICD-10-CM | POA: Diagnosis not present

## 2021-10-24 DIAGNOSIS — M461 Sacroiliitis, not elsewhere classified: Secondary | ICD-10-CM | POA: Diagnosis not present

## 2021-10-24 DIAGNOSIS — M9905 Segmental and somatic dysfunction of pelvic region: Secondary | ICD-10-CM | POA: Diagnosis not present

## 2021-10-26 DIAGNOSIS — M9903 Segmental and somatic dysfunction of lumbar region: Secondary | ICD-10-CM | POA: Diagnosis not present

## 2021-10-26 DIAGNOSIS — M9902 Segmental and somatic dysfunction of thoracic region: Secondary | ICD-10-CM | POA: Diagnosis not present

## 2021-10-26 DIAGNOSIS — M4316 Spondylolisthesis, lumbar region: Secondary | ICD-10-CM | POA: Diagnosis not present

## 2021-10-26 DIAGNOSIS — M9905 Segmental and somatic dysfunction of pelvic region: Secondary | ICD-10-CM | POA: Diagnosis not present

## 2021-10-26 DIAGNOSIS — M461 Sacroiliitis, not elsewhere classified: Secondary | ICD-10-CM | POA: Diagnosis not present

## 2021-10-29 DIAGNOSIS — M4316 Spondylolisthesis, lumbar region: Secondary | ICD-10-CM | POA: Diagnosis not present

## 2021-10-29 DIAGNOSIS — M9903 Segmental and somatic dysfunction of lumbar region: Secondary | ICD-10-CM | POA: Diagnosis not present

## 2021-10-29 DIAGNOSIS — M9902 Segmental and somatic dysfunction of thoracic region: Secondary | ICD-10-CM | POA: Diagnosis not present

## 2021-10-29 DIAGNOSIS — M9905 Segmental and somatic dysfunction of pelvic region: Secondary | ICD-10-CM | POA: Diagnosis not present

## 2021-10-29 DIAGNOSIS — M461 Sacroiliitis, not elsewhere classified: Secondary | ICD-10-CM | POA: Diagnosis not present

## 2021-11-02 DIAGNOSIS — G4733 Obstructive sleep apnea (adult) (pediatric): Secondary | ICD-10-CM | POA: Diagnosis not present

## 2021-11-08 DIAGNOSIS — M9903 Segmental and somatic dysfunction of lumbar region: Secondary | ICD-10-CM | POA: Diagnosis not present

## 2021-11-08 DIAGNOSIS — M9905 Segmental and somatic dysfunction of pelvic region: Secondary | ICD-10-CM | POA: Diagnosis not present

## 2021-11-08 DIAGNOSIS — M461 Sacroiliitis, not elsewhere classified: Secondary | ICD-10-CM | POA: Diagnosis not present

## 2021-11-08 DIAGNOSIS — M9902 Segmental and somatic dysfunction of thoracic region: Secondary | ICD-10-CM | POA: Diagnosis not present

## 2021-11-08 DIAGNOSIS — M4316 Spondylolisthesis, lumbar region: Secondary | ICD-10-CM | POA: Diagnosis not present

## 2021-11-10 DIAGNOSIS — G4733 Obstructive sleep apnea (adult) (pediatric): Secondary | ICD-10-CM | POA: Diagnosis not present

## 2021-11-13 DIAGNOSIS — M461 Sacroiliitis, not elsewhere classified: Secondary | ICD-10-CM | POA: Diagnosis not present

## 2021-11-13 DIAGNOSIS — M9903 Segmental and somatic dysfunction of lumbar region: Secondary | ICD-10-CM | POA: Diagnosis not present

## 2021-11-13 DIAGNOSIS — M9905 Segmental and somatic dysfunction of pelvic region: Secondary | ICD-10-CM | POA: Diagnosis not present

## 2021-11-13 DIAGNOSIS — M4316 Spondylolisthesis, lumbar region: Secondary | ICD-10-CM | POA: Diagnosis not present

## 2021-11-13 DIAGNOSIS — M9902 Segmental and somatic dysfunction of thoracic region: Secondary | ICD-10-CM | POA: Diagnosis not present

## 2021-11-15 DIAGNOSIS — M9903 Segmental and somatic dysfunction of lumbar region: Secondary | ICD-10-CM | POA: Diagnosis not present

## 2021-11-15 DIAGNOSIS — M4316 Spondylolisthesis, lumbar region: Secondary | ICD-10-CM | POA: Diagnosis not present

## 2021-11-15 DIAGNOSIS — M9902 Segmental and somatic dysfunction of thoracic region: Secondary | ICD-10-CM | POA: Diagnosis not present

## 2021-11-15 DIAGNOSIS — M461 Sacroiliitis, not elsewhere classified: Secondary | ICD-10-CM | POA: Diagnosis not present

## 2021-11-15 DIAGNOSIS — M9905 Segmental and somatic dysfunction of pelvic region: Secondary | ICD-10-CM | POA: Diagnosis not present

## 2021-11-30 DIAGNOSIS — G4733 Obstructive sleep apnea (adult) (pediatric): Secondary | ICD-10-CM | POA: Diagnosis not present

## 2021-12-10 DIAGNOSIS — E559 Vitamin D deficiency, unspecified: Secondary | ICD-10-CM | POA: Diagnosis not present

## 2021-12-10 DIAGNOSIS — M545 Low back pain, unspecified: Secondary | ICD-10-CM | POA: Diagnosis not present

## 2021-12-10 DIAGNOSIS — N400 Enlarged prostate without lower urinary tract symptoms: Secondary | ICD-10-CM | POA: Diagnosis not present

## 2021-12-10 DIAGNOSIS — J309 Allergic rhinitis, unspecified: Secondary | ICD-10-CM | POA: Diagnosis not present

## 2021-12-10 DIAGNOSIS — I10 Essential (primary) hypertension: Secondary | ICD-10-CM | POA: Diagnosis not present

## 2021-12-10 DIAGNOSIS — R7303 Prediabetes: Secondary | ICD-10-CM | POA: Diagnosis not present

## 2021-12-10 DIAGNOSIS — K219 Gastro-esophageal reflux disease without esophagitis: Secondary | ICD-10-CM | POA: Diagnosis not present

## 2021-12-10 DIAGNOSIS — Z79899 Other long term (current) drug therapy: Secondary | ICD-10-CM | POA: Diagnosis not present

## 2021-12-10 DIAGNOSIS — R69 Illness, unspecified: Secondary | ICD-10-CM | POA: Diagnosis not present

## 2021-12-10 DIAGNOSIS — E785 Hyperlipidemia, unspecified: Secondary | ICD-10-CM | POA: Diagnosis not present

## 2021-12-14 DIAGNOSIS — E559 Vitamin D deficiency, unspecified: Secondary | ICD-10-CM | POA: Diagnosis not present

## 2021-12-14 DIAGNOSIS — R7303 Prediabetes: Secondary | ICD-10-CM | POA: Diagnosis not present

## 2021-12-14 DIAGNOSIS — Z79899 Other long term (current) drug therapy: Secondary | ICD-10-CM | POA: Diagnosis not present

## 2021-12-17 DIAGNOSIS — M4316 Spondylolisthesis, lumbar region: Secondary | ICD-10-CM | POA: Diagnosis not present

## 2021-12-17 DIAGNOSIS — M9902 Segmental and somatic dysfunction of thoracic region: Secondary | ICD-10-CM | POA: Diagnosis not present

## 2021-12-17 DIAGNOSIS — M461 Sacroiliitis, not elsewhere classified: Secondary | ICD-10-CM | POA: Diagnosis not present

## 2021-12-17 DIAGNOSIS — M9903 Segmental and somatic dysfunction of lumbar region: Secondary | ICD-10-CM | POA: Diagnosis not present

## 2021-12-17 DIAGNOSIS — M9905 Segmental and somatic dysfunction of pelvic region: Secondary | ICD-10-CM | POA: Diagnosis not present

## 2021-12-31 DIAGNOSIS — G4733 Obstructive sleep apnea (adult) (pediatric): Secondary | ICD-10-CM | POA: Diagnosis not present

## 2022-01-07 DIAGNOSIS — M4316 Spondylolisthesis, lumbar region: Secondary | ICD-10-CM | POA: Diagnosis not present

## 2022-01-07 DIAGNOSIS — M9902 Segmental and somatic dysfunction of thoracic region: Secondary | ICD-10-CM | POA: Diagnosis not present

## 2022-01-07 DIAGNOSIS — M9903 Segmental and somatic dysfunction of lumbar region: Secondary | ICD-10-CM | POA: Diagnosis not present

## 2022-01-07 DIAGNOSIS — M461 Sacroiliitis, not elsewhere classified: Secondary | ICD-10-CM | POA: Diagnosis not present

## 2022-01-07 DIAGNOSIS — M9905 Segmental and somatic dysfunction of pelvic region: Secondary | ICD-10-CM | POA: Diagnosis not present

## 2022-02-04 DIAGNOSIS — M9905 Segmental and somatic dysfunction of pelvic region: Secondary | ICD-10-CM | POA: Diagnosis not present

## 2022-02-04 DIAGNOSIS — M4316 Spondylolisthesis, lumbar region: Secondary | ICD-10-CM | POA: Diagnosis not present

## 2022-02-04 DIAGNOSIS — M461 Sacroiliitis, not elsewhere classified: Secondary | ICD-10-CM | POA: Diagnosis not present

## 2022-02-04 DIAGNOSIS — M9902 Segmental and somatic dysfunction of thoracic region: Secondary | ICD-10-CM | POA: Diagnosis not present

## 2022-02-04 DIAGNOSIS — G4733 Obstructive sleep apnea (adult) (pediatric): Secondary | ICD-10-CM | POA: Diagnosis not present

## 2022-02-04 DIAGNOSIS — M9903 Segmental and somatic dysfunction of lumbar region: Secondary | ICD-10-CM | POA: Diagnosis not present

## 2022-03-04 DIAGNOSIS — M9905 Segmental and somatic dysfunction of pelvic region: Secondary | ICD-10-CM | POA: Diagnosis not present

## 2022-03-04 DIAGNOSIS — M9902 Segmental and somatic dysfunction of thoracic region: Secondary | ICD-10-CM | POA: Diagnosis not present

## 2022-03-04 DIAGNOSIS — M4316 Spondylolisthesis, lumbar region: Secondary | ICD-10-CM | POA: Diagnosis not present

## 2022-03-04 DIAGNOSIS — M461 Sacroiliitis, not elsewhere classified: Secondary | ICD-10-CM | POA: Diagnosis not present

## 2022-03-04 DIAGNOSIS — M9903 Segmental and somatic dysfunction of lumbar region: Secondary | ICD-10-CM | POA: Diagnosis not present

## 2022-03-09 DIAGNOSIS — G4733 Obstructive sleep apnea (adult) (pediatric): Secondary | ICD-10-CM | POA: Diagnosis not present

## 2022-03-13 DIAGNOSIS — R69 Illness, unspecified: Secondary | ICD-10-CM | POA: Diagnosis not present

## 2022-03-13 DIAGNOSIS — J309 Allergic rhinitis, unspecified: Secondary | ICD-10-CM | POA: Diagnosis not present

## 2022-03-13 DIAGNOSIS — E785 Hyperlipidemia, unspecified: Secondary | ICD-10-CM | POA: Diagnosis not present

## 2022-03-13 DIAGNOSIS — R7303 Prediabetes: Secondary | ICD-10-CM | POA: Diagnosis not present

## 2022-03-13 DIAGNOSIS — M545 Low back pain, unspecified: Secondary | ICD-10-CM | POA: Diagnosis not present

## 2022-03-13 DIAGNOSIS — K219 Gastro-esophageal reflux disease without esophagitis: Secondary | ICD-10-CM | POA: Diagnosis not present

## 2022-03-13 DIAGNOSIS — Z79899 Other long term (current) drug therapy: Secondary | ICD-10-CM | POA: Diagnosis not present

## 2022-03-13 DIAGNOSIS — M1610 Unilateral primary osteoarthritis, unspecified hip: Secondary | ICD-10-CM | POA: Diagnosis not present

## 2022-03-13 DIAGNOSIS — M179 Osteoarthritis of knee, unspecified: Secondary | ICD-10-CM | POA: Diagnosis not present

## 2022-03-13 DIAGNOSIS — E559 Vitamin D deficiency, unspecified: Secondary | ICD-10-CM | POA: Diagnosis not present

## 2022-03-13 DIAGNOSIS — N400 Enlarged prostate without lower urinary tract symptoms: Secondary | ICD-10-CM | POA: Diagnosis not present

## 2022-03-13 DIAGNOSIS — I10 Essential (primary) hypertension: Secondary | ICD-10-CM | POA: Diagnosis not present

## 2022-04-29 DIAGNOSIS — G5602 Carpal tunnel syndrome, left upper limb: Secondary | ICD-10-CM | POA: Diagnosis not present

## 2022-04-29 DIAGNOSIS — M7072 Other bursitis of hip, left hip: Secondary | ICD-10-CM | POA: Diagnosis not present

## 2022-04-29 DIAGNOSIS — M545 Low back pain, unspecified: Secondary | ICD-10-CM | POA: Diagnosis not present

## 2022-04-29 DIAGNOSIS — Z6832 Body mass index (BMI) 32.0-32.9, adult: Secondary | ICD-10-CM | POA: Diagnosis not present

## 2022-04-29 DIAGNOSIS — E669 Obesity, unspecified: Secondary | ICD-10-CM | POA: Diagnosis not present

## 2022-06-07 DIAGNOSIS — Z0184 Encounter for antibody response examination: Secondary | ICD-10-CM | POA: Diagnosis not present

## 2022-06-14 DIAGNOSIS — G4733 Obstructive sleep apnea (adult) (pediatric): Secondary | ICD-10-CM | POA: Diagnosis not present

## 2022-06-20 DIAGNOSIS — H52223 Regular astigmatism, bilateral: Secondary | ICD-10-CM | POA: Diagnosis not present

## 2022-06-20 DIAGNOSIS — R7303 Prediabetes: Secondary | ICD-10-CM | POA: Diagnosis not present

## 2022-06-20 DIAGNOSIS — Z961 Presence of intraocular lens: Secondary | ICD-10-CM | POA: Diagnosis not present

## 2022-06-20 DIAGNOSIS — Z135 Encounter for screening for eye and ear disorders: Secondary | ICD-10-CM | POA: Diagnosis not present

## 2022-06-20 DIAGNOSIS — H2512 Age-related nuclear cataract, left eye: Secondary | ICD-10-CM | POA: Diagnosis not present

## 2022-06-26 DIAGNOSIS — M79605 Pain in left leg: Secondary | ICD-10-CM | POA: Diagnosis not present

## 2022-06-26 DIAGNOSIS — M545 Low back pain, unspecified: Secondary | ICD-10-CM | POA: Diagnosis not present

## 2022-06-26 DIAGNOSIS — Z6832 Body mass index (BMI) 32.0-32.9, adult: Secondary | ICD-10-CM | POA: Diagnosis not present

## 2022-06-26 DIAGNOSIS — R7303 Prediabetes: Secondary | ICD-10-CM | POA: Diagnosis not present

## 2022-06-26 DIAGNOSIS — E785 Hyperlipidemia, unspecified: Secondary | ICD-10-CM | POA: Diagnosis not present

## 2022-06-26 DIAGNOSIS — J309 Allergic rhinitis, unspecified: Secondary | ICD-10-CM | POA: Diagnosis not present

## 2022-06-26 DIAGNOSIS — I10 Essential (primary) hypertension: Secondary | ICD-10-CM | POA: Diagnosis not present

## 2022-06-26 DIAGNOSIS — E559 Vitamin D deficiency, unspecified: Secondary | ICD-10-CM | POA: Diagnosis not present

## 2022-06-26 DIAGNOSIS — M79604 Pain in right leg: Secondary | ICD-10-CM | POA: Diagnosis not present

## 2022-07-01 DIAGNOSIS — R7303 Prediabetes: Secondary | ICD-10-CM | POA: Diagnosis not present

## 2022-07-01 DIAGNOSIS — E559 Vitamin D deficiency, unspecified: Secondary | ICD-10-CM | POA: Diagnosis not present

## 2022-07-01 DIAGNOSIS — Z79899 Other long term (current) drug therapy: Secondary | ICD-10-CM | POA: Diagnosis not present

## 2022-07-01 DIAGNOSIS — E785 Hyperlipidemia, unspecified: Secondary | ICD-10-CM | POA: Diagnosis not present

## 2022-07-09 DIAGNOSIS — H43811 Vitreous degeneration, right eye: Secondary | ICD-10-CM | POA: Diagnosis not present

## 2022-07-29 DIAGNOSIS — M25552 Pain in left hip: Secondary | ICD-10-CM | POA: Diagnosis not present

## 2022-07-29 DIAGNOSIS — F3341 Major depressive disorder, recurrent, in partial remission: Secondary | ICD-10-CM | POA: Diagnosis not present

## 2022-07-29 DIAGNOSIS — J309 Allergic rhinitis, unspecified: Secondary | ICD-10-CM | POA: Diagnosis not present

## 2022-07-29 DIAGNOSIS — G8929 Other chronic pain: Secondary | ICD-10-CM | POA: Diagnosis not present

## 2022-07-29 DIAGNOSIS — E559 Vitamin D deficiency, unspecified: Secondary | ICD-10-CM | POA: Diagnosis not present

## 2022-07-29 DIAGNOSIS — M545 Low back pain, unspecified: Secondary | ICD-10-CM | POA: Diagnosis not present

## 2022-07-29 DIAGNOSIS — I10 Essential (primary) hypertension: Secondary | ICD-10-CM | POA: Diagnosis not present

## 2022-07-29 DIAGNOSIS — R7303 Prediabetes: Secondary | ICD-10-CM | POA: Diagnosis not present

## 2022-07-29 DIAGNOSIS — M159 Polyosteoarthritis, unspecified: Secondary | ICD-10-CM | POA: Diagnosis not present

## 2022-07-29 DIAGNOSIS — E785 Hyperlipidemia, unspecified: Secondary | ICD-10-CM | POA: Diagnosis not present

## 2022-07-29 DIAGNOSIS — G2581 Restless legs syndrome: Secondary | ICD-10-CM | POA: Diagnosis not present

## 2022-07-29 DIAGNOSIS — K219 Gastro-esophageal reflux disease without esophagitis: Secondary | ICD-10-CM | POA: Diagnosis not present

## 2022-09-05 DIAGNOSIS — G4733 Obstructive sleep apnea (adult) (pediatric): Secondary | ICD-10-CM | POA: Diagnosis not present

## 2022-09-18 DIAGNOSIS — H25812 Combined forms of age-related cataract, left eye: Secondary | ICD-10-CM | POA: Diagnosis not present

## 2022-09-18 DIAGNOSIS — H52222 Regular astigmatism, left eye: Secondary | ICD-10-CM | POA: Diagnosis not present

## 2022-09-18 DIAGNOSIS — E119 Type 2 diabetes mellitus without complications: Secondary | ICD-10-CM | POA: Diagnosis not present

## 2022-09-19 DIAGNOSIS — E559 Vitamin D deficiency, unspecified: Secondary | ICD-10-CM | POA: Diagnosis not present

## 2022-09-19 DIAGNOSIS — E785 Hyperlipidemia, unspecified: Secondary | ICD-10-CM | POA: Diagnosis not present

## 2022-09-19 DIAGNOSIS — M545 Low back pain, unspecified: Secondary | ICD-10-CM | POA: Diagnosis not present

## 2022-09-19 DIAGNOSIS — R7303 Prediabetes: Secondary | ICD-10-CM | POA: Diagnosis not present

## 2022-09-19 DIAGNOSIS — I10 Essential (primary) hypertension: Secondary | ICD-10-CM | POA: Diagnosis not present

## 2022-09-19 DIAGNOSIS — G2581 Restless legs syndrome: Secondary | ICD-10-CM | POA: Diagnosis not present

## 2022-09-19 DIAGNOSIS — F3341 Major depressive disorder, recurrent, in partial remission: Secondary | ICD-10-CM | POA: Diagnosis not present

## 2022-09-19 DIAGNOSIS — M159 Polyosteoarthritis, unspecified: Secondary | ICD-10-CM | POA: Diagnosis not present

## 2022-09-19 DIAGNOSIS — G8929 Other chronic pain: Secondary | ICD-10-CM | POA: Diagnosis not present

## 2022-09-19 DIAGNOSIS — F419 Anxiety disorder, unspecified: Secondary | ICD-10-CM | POA: Diagnosis not present

## 2022-09-19 DIAGNOSIS — K219 Gastro-esophageal reflux disease without esophagitis: Secondary | ICD-10-CM | POA: Diagnosis not present

## 2022-09-19 DIAGNOSIS — J309 Allergic rhinitis, unspecified: Secondary | ICD-10-CM | POA: Diagnosis not present

## 2022-09-24 DIAGNOSIS — E1136 Type 2 diabetes mellitus with diabetic cataract: Secondary | ICD-10-CM | POA: Diagnosis not present

## 2022-09-24 DIAGNOSIS — K219 Gastro-esophageal reflux disease without esophagitis: Secondary | ICD-10-CM | POA: Diagnosis not present

## 2022-09-24 DIAGNOSIS — Z881 Allergy status to other antibiotic agents status: Secondary | ICD-10-CM | POA: Diagnosis not present

## 2022-09-24 DIAGNOSIS — Z6832 Body mass index (BMI) 32.0-32.9, adult: Secondary | ICD-10-CM | POA: Diagnosis not present

## 2022-09-24 DIAGNOSIS — E785 Hyperlipidemia, unspecified: Secondary | ICD-10-CM | POA: Diagnosis not present

## 2022-09-24 DIAGNOSIS — Z9841 Cataract extraction status, right eye: Secondary | ICD-10-CM | POA: Diagnosis not present

## 2022-09-24 DIAGNOSIS — E669 Obesity, unspecified: Secondary | ICD-10-CM | POA: Diagnosis not present

## 2022-09-24 DIAGNOSIS — G4730 Sleep apnea, unspecified: Secondary | ICD-10-CM | POA: Diagnosis not present

## 2022-09-24 DIAGNOSIS — H25812 Combined forms of age-related cataract, left eye: Secondary | ICD-10-CM | POA: Diagnosis not present

## 2022-09-24 DIAGNOSIS — I10 Essential (primary) hypertension: Secondary | ICD-10-CM | POA: Diagnosis not present

## 2022-09-24 DIAGNOSIS — H52223 Regular astigmatism, bilateral: Secondary | ICD-10-CM | POA: Diagnosis not present

## 2022-09-24 DIAGNOSIS — Z961 Presence of intraocular lens: Secondary | ICD-10-CM | POA: Diagnosis not present

## 2022-10-16 DIAGNOSIS — H524 Presbyopia: Secondary | ICD-10-CM | POA: Diagnosis not present

## 2022-10-16 DIAGNOSIS — H52223 Regular astigmatism, bilateral: Secondary | ICD-10-CM | POA: Diagnosis not present

## 2022-11-05 DIAGNOSIS — M1711 Unilateral primary osteoarthritis, right knee: Secondary | ICD-10-CM | POA: Diagnosis not present

## 2022-11-05 DIAGNOSIS — M25561 Pain in right knee: Secondary | ICD-10-CM | POA: Diagnosis not present

## 2022-11-05 DIAGNOSIS — G8929 Other chronic pain: Secondary | ICD-10-CM | POA: Diagnosis not present

## 2022-11-25 DIAGNOSIS — M545 Low back pain, unspecified: Secondary | ICD-10-CM | POA: Diagnosis not present

## 2022-11-25 DIAGNOSIS — R7303 Prediabetes: Secondary | ICD-10-CM | POA: Diagnosis not present

## 2022-11-25 DIAGNOSIS — G2581 Restless legs syndrome: Secondary | ICD-10-CM | POA: Diagnosis not present

## 2022-11-25 DIAGNOSIS — N1831 Chronic kidney disease, stage 3a: Secondary | ICD-10-CM | POA: Diagnosis not present

## 2022-11-25 DIAGNOSIS — J309 Allergic rhinitis, unspecified: Secondary | ICD-10-CM | POA: Diagnosis not present

## 2022-11-25 DIAGNOSIS — E785 Hyperlipidemia, unspecified: Secondary | ICD-10-CM | POA: Diagnosis not present

## 2022-11-25 DIAGNOSIS — I10 Essential (primary) hypertension: Secondary | ICD-10-CM | POA: Diagnosis not present

## 2022-11-25 DIAGNOSIS — F3341 Major depressive disorder, recurrent, in partial remission: Secondary | ICD-10-CM | POA: Diagnosis not present

## 2022-11-25 DIAGNOSIS — G8929 Other chronic pain: Secondary | ICD-10-CM | POA: Diagnosis not present

## 2022-11-25 DIAGNOSIS — E559 Vitamin D deficiency, unspecified: Secondary | ICD-10-CM | POA: Diagnosis not present

## 2022-11-25 DIAGNOSIS — M159 Polyosteoarthritis, unspecified: Secondary | ICD-10-CM | POA: Diagnosis not present

## 2022-11-25 DIAGNOSIS — K219 Gastro-esophageal reflux disease without esophagitis: Secondary | ICD-10-CM | POA: Diagnosis not present

## 2022-12-03 DIAGNOSIS — M159 Polyosteoarthritis, unspecified: Secondary | ICD-10-CM | POA: Diagnosis not present

## 2022-12-03 DIAGNOSIS — N1831 Chronic kidney disease, stage 3a: Secondary | ICD-10-CM | POA: Diagnosis not present

## 2022-12-03 DIAGNOSIS — M545 Low back pain, unspecified: Secondary | ICD-10-CM | POA: Diagnosis not present

## 2022-12-03 DIAGNOSIS — G4733 Obstructive sleep apnea (adult) (pediatric): Secondary | ICD-10-CM | POA: Diagnosis not present

## 2022-12-03 DIAGNOSIS — G8929 Other chronic pain: Secondary | ICD-10-CM | POA: Diagnosis not present

## 2022-12-03 DIAGNOSIS — I10 Essential (primary) hypertension: Secondary | ICD-10-CM | POA: Diagnosis not present

## 2022-12-03 DIAGNOSIS — E559 Vitamin D deficiency, unspecified: Secondary | ICD-10-CM | POA: Diagnosis not present

## 2022-12-03 DIAGNOSIS — R7303 Prediabetes: Secondary | ICD-10-CM | POA: Diagnosis not present

## 2022-12-03 DIAGNOSIS — E785 Hyperlipidemia, unspecified: Secondary | ICD-10-CM | POA: Diagnosis not present

## 2022-12-03 DIAGNOSIS — M25561 Pain in right knee: Secondary | ICD-10-CM | POA: Diagnosis not present

## 2022-12-03 DIAGNOSIS — J309 Allergic rhinitis, unspecified: Secondary | ICD-10-CM | POA: Diagnosis not present

## 2022-12-03 DIAGNOSIS — K219 Gastro-esophageal reflux disease without esophagitis: Secondary | ICD-10-CM | POA: Diagnosis not present

## 2022-12-03 DIAGNOSIS — G2581 Restless legs syndrome: Secondary | ICD-10-CM | POA: Diagnosis not present

## 2022-12-17 DIAGNOSIS — N1831 Chronic kidney disease, stage 3a: Secondary | ICD-10-CM | POA: Diagnosis not present

## 2022-12-17 DIAGNOSIS — E785 Hyperlipidemia, unspecified: Secondary | ICD-10-CM | POA: Diagnosis not present

## 2022-12-17 DIAGNOSIS — K219 Gastro-esophageal reflux disease without esophagitis: Secondary | ICD-10-CM | POA: Diagnosis not present

## 2022-12-17 DIAGNOSIS — G2581 Restless legs syndrome: Secondary | ICD-10-CM | POA: Diagnosis not present

## 2022-12-17 DIAGNOSIS — G8929 Other chronic pain: Secondary | ICD-10-CM | POA: Diagnosis not present

## 2022-12-17 DIAGNOSIS — J309 Allergic rhinitis, unspecified: Secondary | ICD-10-CM | POA: Diagnosis not present

## 2022-12-17 DIAGNOSIS — M545 Low back pain, unspecified: Secondary | ICD-10-CM | POA: Diagnosis not present

## 2022-12-17 DIAGNOSIS — M159 Polyosteoarthritis, unspecified: Secondary | ICD-10-CM | POA: Diagnosis not present

## 2022-12-17 DIAGNOSIS — E559 Vitamin D deficiency, unspecified: Secondary | ICD-10-CM | POA: Diagnosis not present

## 2022-12-17 DIAGNOSIS — M25561 Pain in right knee: Secondary | ICD-10-CM | POA: Diagnosis not present

## 2022-12-17 DIAGNOSIS — R7303 Prediabetes: Secondary | ICD-10-CM | POA: Diagnosis not present

## 2022-12-17 DIAGNOSIS — I10 Essential (primary) hypertension: Secondary | ICD-10-CM | POA: Diagnosis not present

## 2022-12-24 DIAGNOSIS — G8929 Other chronic pain: Secondary | ICD-10-CM | POA: Diagnosis not present

## 2022-12-24 DIAGNOSIS — K219 Gastro-esophageal reflux disease without esophagitis: Secondary | ICD-10-CM | POA: Diagnosis not present

## 2022-12-24 DIAGNOSIS — I10 Essential (primary) hypertension: Secondary | ICD-10-CM | POA: Diagnosis not present

## 2022-12-24 DIAGNOSIS — J309 Allergic rhinitis, unspecified: Secondary | ICD-10-CM | POA: Diagnosis not present

## 2022-12-24 DIAGNOSIS — G2581 Restless legs syndrome: Secondary | ICD-10-CM | POA: Diagnosis not present

## 2022-12-24 DIAGNOSIS — R7303 Prediabetes: Secondary | ICD-10-CM | POA: Diagnosis not present

## 2022-12-24 DIAGNOSIS — E785 Hyperlipidemia, unspecified: Secondary | ICD-10-CM | POA: Diagnosis not present

## 2022-12-24 DIAGNOSIS — E559 Vitamin D deficiency, unspecified: Secondary | ICD-10-CM | POA: Diagnosis not present

## 2022-12-24 DIAGNOSIS — M159 Polyosteoarthritis, unspecified: Secondary | ICD-10-CM | POA: Diagnosis not present

## 2022-12-24 DIAGNOSIS — M545 Low back pain, unspecified: Secondary | ICD-10-CM | POA: Diagnosis not present

## 2022-12-24 DIAGNOSIS — J069 Acute upper respiratory infection, unspecified: Secondary | ICD-10-CM | POA: Diagnosis not present

## 2022-12-24 DIAGNOSIS — N1831 Chronic kidney disease, stage 3a: Secondary | ICD-10-CM | POA: Diagnosis not present

## 2023-01-13 DIAGNOSIS — E785 Hyperlipidemia, unspecified: Secondary | ICD-10-CM | POA: Diagnosis not present

## 2023-01-13 DIAGNOSIS — F3341 Major depressive disorder, recurrent, in partial remission: Secondary | ICD-10-CM | POA: Diagnosis not present

## 2023-01-13 DIAGNOSIS — M159 Polyosteoarthritis, unspecified: Secondary | ICD-10-CM | POA: Diagnosis not present

## 2023-01-13 DIAGNOSIS — J309 Allergic rhinitis, unspecified: Secondary | ICD-10-CM | POA: Diagnosis not present

## 2023-01-13 DIAGNOSIS — G2581 Restless legs syndrome: Secondary | ICD-10-CM | POA: Diagnosis not present

## 2023-01-13 DIAGNOSIS — I10 Essential (primary) hypertension: Secondary | ICD-10-CM | POA: Diagnosis not present

## 2023-01-13 DIAGNOSIS — G8929 Other chronic pain: Secondary | ICD-10-CM | POA: Diagnosis not present

## 2023-01-13 DIAGNOSIS — R7303 Prediabetes: Secondary | ICD-10-CM | POA: Diagnosis not present

## 2023-01-13 DIAGNOSIS — K219 Gastro-esophageal reflux disease without esophagitis: Secondary | ICD-10-CM | POA: Diagnosis not present

## 2023-01-13 DIAGNOSIS — E559 Vitamin D deficiency, unspecified: Secondary | ICD-10-CM | POA: Diagnosis not present

## 2023-01-13 DIAGNOSIS — M25552 Pain in left hip: Secondary | ICD-10-CM | POA: Diagnosis not present

## 2023-01-13 DIAGNOSIS — M545 Low back pain, unspecified: Secondary | ICD-10-CM | POA: Diagnosis not present

## 2023-02-07 DIAGNOSIS — M25561 Pain in right knee: Secondary | ICD-10-CM | POA: Diagnosis not present

## 2023-02-07 DIAGNOSIS — R7303 Prediabetes: Secondary | ICD-10-CM | POA: Diagnosis not present

## 2023-02-07 DIAGNOSIS — E785 Hyperlipidemia, unspecified: Secondary | ICD-10-CM | POA: Diagnosis not present

## 2023-02-07 DIAGNOSIS — K219 Gastro-esophageal reflux disease without esophagitis: Secondary | ICD-10-CM | POA: Diagnosis not present

## 2023-02-07 DIAGNOSIS — G8929 Other chronic pain: Secondary | ICD-10-CM | POA: Diagnosis not present

## 2023-02-07 DIAGNOSIS — I10 Essential (primary) hypertension: Secondary | ICD-10-CM | POA: Diagnosis not present

## 2023-02-07 DIAGNOSIS — E559 Vitamin D deficiency, unspecified: Secondary | ICD-10-CM | POA: Diagnosis not present

## 2023-02-07 DIAGNOSIS — G2581 Restless legs syndrome: Secondary | ICD-10-CM | POA: Diagnosis not present

## 2023-02-07 DIAGNOSIS — N1831 Chronic kidney disease, stage 3a: Secondary | ICD-10-CM | POA: Diagnosis not present

## 2023-02-07 DIAGNOSIS — J309 Allergic rhinitis, unspecified: Secondary | ICD-10-CM | POA: Diagnosis not present

## 2023-02-07 DIAGNOSIS — M545 Low back pain, unspecified: Secondary | ICD-10-CM | POA: Diagnosis not present

## 2023-02-07 DIAGNOSIS — M159 Polyosteoarthritis, unspecified: Secondary | ICD-10-CM | POA: Diagnosis not present

## 2023-02-24 DIAGNOSIS — K219 Gastro-esophageal reflux disease without esophagitis: Secondary | ICD-10-CM | POA: Diagnosis not present

## 2023-02-24 DIAGNOSIS — J069 Acute upper respiratory infection, unspecified: Secondary | ICD-10-CM | POA: Diagnosis not present

## 2023-02-24 DIAGNOSIS — R7303 Prediabetes: Secondary | ICD-10-CM | POA: Diagnosis not present

## 2023-02-24 DIAGNOSIS — I10 Essential (primary) hypertension: Secondary | ICD-10-CM | POA: Diagnosis not present

## 2023-02-24 DIAGNOSIS — M545 Low back pain, unspecified: Secondary | ICD-10-CM | POA: Diagnosis not present

## 2023-02-24 DIAGNOSIS — J309 Allergic rhinitis, unspecified: Secondary | ICD-10-CM | POA: Diagnosis not present

## 2023-02-24 DIAGNOSIS — N1831 Chronic kidney disease, stage 3a: Secondary | ICD-10-CM | POA: Diagnosis not present

## 2023-02-24 DIAGNOSIS — G2581 Restless legs syndrome: Secondary | ICD-10-CM | POA: Diagnosis not present

## 2023-02-24 DIAGNOSIS — E559 Vitamin D deficiency, unspecified: Secondary | ICD-10-CM | POA: Diagnosis not present

## 2023-02-24 DIAGNOSIS — G8929 Other chronic pain: Secondary | ICD-10-CM | POA: Diagnosis not present

## 2023-02-24 DIAGNOSIS — E785 Hyperlipidemia, unspecified: Secondary | ICD-10-CM | POA: Diagnosis not present

## 2023-02-24 DIAGNOSIS — M159 Polyosteoarthritis, unspecified: Secondary | ICD-10-CM | POA: Diagnosis not present

## 2023-03-10 DIAGNOSIS — G2581 Restless legs syndrome: Secondary | ICD-10-CM | POA: Diagnosis not present

## 2023-03-10 DIAGNOSIS — F3341 Major depressive disorder, recurrent, in partial remission: Secondary | ICD-10-CM | POA: Diagnosis not present

## 2023-03-10 DIAGNOSIS — E785 Hyperlipidemia, unspecified: Secondary | ICD-10-CM | POA: Diagnosis not present

## 2023-03-10 DIAGNOSIS — E559 Vitamin D deficiency, unspecified: Secondary | ICD-10-CM | POA: Diagnosis not present

## 2023-03-10 DIAGNOSIS — I10 Essential (primary) hypertension: Secondary | ICD-10-CM | POA: Diagnosis not present

## 2023-03-10 DIAGNOSIS — K219 Gastro-esophageal reflux disease without esophagitis: Secondary | ICD-10-CM | POA: Diagnosis not present

## 2023-03-10 DIAGNOSIS — J309 Allergic rhinitis, unspecified: Secondary | ICD-10-CM | POA: Diagnosis not present

## 2023-03-10 DIAGNOSIS — N1831 Chronic kidney disease, stage 3a: Secondary | ICD-10-CM | POA: Diagnosis not present

## 2023-03-10 DIAGNOSIS — G8929 Other chronic pain: Secondary | ICD-10-CM | POA: Diagnosis not present

## 2023-03-10 DIAGNOSIS — R7303 Prediabetes: Secondary | ICD-10-CM | POA: Diagnosis not present

## 2023-03-10 DIAGNOSIS — M159 Polyosteoarthritis, unspecified: Secondary | ICD-10-CM | POA: Diagnosis not present

## 2023-03-10 DIAGNOSIS — M545 Low back pain, unspecified: Secondary | ICD-10-CM | POA: Diagnosis not present

## 2023-03-11 DIAGNOSIS — M1711 Unilateral primary osteoarthritis, right knee: Secondary | ICD-10-CM | POA: Diagnosis not present

## 2023-03-11 DIAGNOSIS — G8929 Other chronic pain: Secondary | ICD-10-CM | POA: Diagnosis not present

## 2023-03-11 DIAGNOSIS — M25561 Pain in right knee: Secondary | ICD-10-CM | POA: Diagnosis not present

## 2023-03-25 DIAGNOSIS — G4733 Obstructive sleep apnea (adult) (pediatric): Secondary | ICD-10-CM | POA: Diagnosis not present

## 2023-03-31 DIAGNOSIS — R7303 Prediabetes: Secondary | ICD-10-CM | POA: Diagnosis not present

## 2023-03-31 DIAGNOSIS — M545 Low back pain, unspecified: Secondary | ICD-10-CM | POA: Diagnosis not present

## 2023-03-31 DIAGNOSIS — E559 Vitamin D deficiency, unspecified: Secondary | ICD-10-CM | POA: Diagnosis not present

## 2023-03-31 DIAGNOSIS — Z01810 Encounter for preprocedural cardiovascular examination: Secondary | ICD-10-CM | POA: Diagnosis not present

## 2023-03-31 DIAGNOSIS — I10 Essential (primary) hypertension: Secondary | ICD-10-CM | POA: Diagnosis not present

## 2023-03-31 DIAGNOSIS — E785 Hyperlipidemia, unspecified: Secondary | ICD-10-CM | POA: Diagnosis not present

## 2023-03-31 DIAGNOSIS — N1831 Chronic kidney disease, stage 3a: Secondary | ICD-10-CM | POA: Diagnosis not present

## 2023-03-31 DIAGNOSIS — M159 Polyosteoarthritis, unspecified: Secondary | ICD-10-CM | POA: Diagnosis not present

## 2023-03-31 DIAGNOSIS — G8929 Other chronic pain: Secondary | ICD-10-CM | POA: Diagnosis not present

## 2023-03-31 DIAGNOSIS — J309 Allergic rhinitis, unspecified: Secondary | ICD-10-CM | POA: Diagnosis not present

## 2023-04-14 DIAGNOSIS — N1831 Chronic kidney disease, stage 3a: Secondary | ICD-10-CM | POA: Diagnosis not present

## 2023-04-14 DIAGNOSIS — E559 Vitamin D deficiency, unspecified: Secondary | ICD-10-CM | POA: Diagnosis not present

## 2023-04-14 DIAGNOSIS — M545 Low back pain, unspecified: Secondary | ICD-10-CM | POA: Diagnosis not present

## 2023-04-14 DIAGNOSIS — G2581 Restless legs syndrome: Secondary | ICD-10-CM | POA: Diagnosis not present

## 2023-04-14 DIAGNOSIS — E785 Hyperlipidemia, unspecified: Secondary | ICD-10-CM | POA: Diagnosis not present

## 2023-04-14 DIAGNOSIS — I10 Essential (primary) hypertension: Secondary | ICD-10-CM | POA: Diagnosis not present

## 2023-04-14 DIAGNOSIS — F3341 Major depressive disorder, recurrent, in partial remission: Secondary | ICD-10-CM | POA: Diagnosis not present

## 2023-04-14 DIAGNOSIS — M159 Polyosteoarthritis, unspecified: Secondary | ICD-10-CM | POA: Diagnosis not present

## 2023-04-14 DIAGNOSIS — G8929 Other chronic pain: Secondary | ICD-10-CM | POA: Diagnosis not present

## 2023-04-14 DIAGNOSIS — J309 Allergic rhinitis, unspecified: Secondary | ICD-10-CM | POA: Diagnosis not present

## 2023-04-14 DIAGNOSIS — K219 Gastro-esophageal reflux disease without esophagitis: Secondary | ICD-10-CM | POA: Diagnosis not present

## 2023-04-14 DIAGNOSIS — R7303 Prediabetes: Secondary | ICD-10-CM | POA: Diagnosis not present

## 2023-04-17 DIAGNOSIS — M545 Low back pain, unspecified: Secondary | ICD-10-CM | POA: Diagnosis not present

## 2023-04-17 DIAGNOSIS — E559 Vitamin D deficiency, unspecified: Secondary | ICD-10-CM | POA: Diagnosis not present

## 2023-04-17 DIAGNOSIS — E785 Hyperlipidemia, unspecified: Secondary | ICD-10-CM | POA: Diagnosis not present

## 2023-04-17 DIAGNOSIS — K219 Gastro-esophageal reflux disease without esophagitis: Secondary | ICD-10-CM | POA: Diagnosis not present

## 2023-04-17 DIAGNOSIS — N1831 Chronic kidney disease, stage 3a: Secondary | ICD-10-CM | POA: Diagnosis not present

## 2023-04-17 DIAGNOSIS — I10 Essential (primary) hypertension: Secondary | ICD-10-CM | POA: Diagnosis not present

## 2023-04-17 DIAGNOSIS — M159 Polyosteoarthritis, unspecified: Secondary | ICD-10-CM | POA: Diagnosis not present

## 2023-04-17 DIAGNOSIS — J309 Allergic rhinitis, unspecified: Secondary | ICD-10-CM | POA: Diagnosis not present

## 2023-04-17 DIAGNOSIS — R7303 Prediabetes: Secondary | ICD-10-CM | POA: Diagnosis not present

## 2023-04-17 DIAGNOSIS — G8929 Other chronic pain: Secondary | ICD-10-CM | POA: Diagnosis not present

## 2023-04-17 DIAGNOSIS — G2581 Restless legs syndrome: Secondary | ICD-10-CM | POA: Diagnosis not present

## 2023-04-17 DIAGNOSIS — M25561 Pain in right knee: Secondary | ICD-10-CM | POA: Diagnosis not present

## 2023-04-21 DIAGNOSIS — M25561 Pain in right knee: Secondary | ICD-10-CM | POA: Diagnosis not present

## 2023-04-24 DIAGNOSIS — M25561 Pain in right knee: Secondary | ICD-10-CM | POA: Diagnosis not present

## 2023-05-01 DIAGNOSIS — M1711 Unilateral primary osteoarthritis, right knee: Secondary | ICD-10-CM | POA: Diagnosis not present

## 2023-05-02 DIAGNOSIS — M25561 Pain in right knee: Secondary | ICD-10-CM | POA: Diagnosis not present

## 2023-05-06 DIAGNOSIS — M25561 Pain in right knee: Secondary | ICD-10-CM | POA: Diagnosis not present

## 2023-05-07 DIAGNOSIS — S0990XA Unspecified injury of head, initial encounter: Secondary | ICD-10-CM | POA: Diagnosis not present

## 2023-05-07 DIAGNOSIS — S0181XA Laceration without foreign body of other part of head, initial encounter: Secondary | ICD-10-CM | POA: Diagnosis not present

## 2023-05-07 DIAGNOSIS — W010XXA Fall on same level from slipping, tripping and stumbling without subsequent striking against object, initial encounter: Secondary | ICD-10-CM | POA: Diagnosis not present

## 2023-05-07 DIAGNOSIS — Z23 Encounter for immunization: Secondary | ICD-10-CM | POA: Diagnosis not present

## 2023-05-07 DIAGNOSIS — S0101XA Laceration without foreign body of scalp, initial encounter: Secondary | ICD-10-CM | POA: Diagnosis not present

## 2023-05-13 DIAGNOSIS — M25561 Pain in right knee: Secondary | ICD-10-CM | POA: Diagnosis not present

## 2023-05-15 DIAGNOSIS — M25561 Pain in right knee: Secondary | ICD-10-CM | POA: Diagnosis not present

## 2023-05-16 ENCOUNTER — Telehealth: Payer: Self-pay | Admitting: *Deleted

## 2023-05-16 NOTE — Telephone Encounter (Signed)
Transition Care Management Unsuccessful Follow-up Telephone Call  Date of discharge and from where:  [facility]  05/07/2023  Attempts:  1st Attempt  Reason for unsuccessful TCM follow-up call:  No answer/busy

## 2023-05-20 ENCOUNTER — Telehealth: Payer: Self-pay | Admitting: *Deleted

## 2023-05-20 DIAGNOSIS — M25561 Pain in right knee: Secondary | ICD-10-CM | POA: Diagnosis not present

## 2023-05-20 NOTE — Telephone Encounter (Signed)
Transition Care Management Unsuccessful Follow-up Telephone Call  Date of discharge and from where:  [facility]  05/20/2023  Attempts:  2nd Attempt  Reason for unsuccessful TCM follow-up call:  No answer/busy

## 2023-05-22 DIAGNOSIS — M25561 Pain in right knee: Secondary | ICD-10-CM | POA: Diagnosis not present

## 2023-05-23 DIAGNOSIS — I10 Essential (primary) hypertension: Secondary | ICD-10-CM | POA: Diagnosis not present

## 2023-05-23 DIAGNOSIS — G8929 Other chronic pain: Secondary | ICD-10-CM | POA: Diagnosis not present

## 2023-05-23 DIAGNOSIS — E785 Hyperlipidemia, unspecified: Secondary | ICD-10-CM | POA: Diagnosis not present

## 2023-05-23 DIAGNOSIS — N1831 Chronic kidney disease, stage 3a: Secondary | ICD-10-CM | POA: Diagnosis not present

## 2023-05-23 DIAGNOSIS — K219 Gastro-esophageal reflux disease without esophagitis: Secondary | ICD-10-CM | POA: Diagnosis not present

## 2023-05-23 DIAGNOSIS — E559 Vitamin D deficiency, unspecified: Secondary | ICD-10-CM | POA: Diagnosis not present

## 2023-05-23 DIAGNOSIS — G2581 Restless legs syndrome: Secondary | ICD-10-CM | POA: Diagnosis not present

## 2023-05-23 DIAGNOSIS — M159 Polyosteoarthritis, unspecified: Secondary | ICD-10-CM | POA: Diagnosis not present

## 2023-05-23 DIAGNOSIS — J309 Allergic rhinitis, unspecified: Secondary | ICD-10-CM | POA: Diagnosis not present

## 2023-05-23 DIAGNOSIS — R7303 Prediabetes: Secondary | ICD-10-CM | POA: Diagnosis not present

## 2023-05-23 DIAGNOSIS — M25561 Pain in right knee: Secondary | ICD-10-CM | POA: Diagnosis not present

## 2023-05-23 DIAGNOSIS — M545 Low back pain, unspecified: Secondary | ICD-10-CM | POA: Diagnosis not present

## 2023-05-27 DIAGNOSIS — M4316 Spondylolisthesis, lumbar region: Secondary | ICD-10-CM | POA: Diagnosis not present

## 2023-05-27 DIAGNOSIS — M7918 Myalgia, other site: Secondary | ICD-10-CM | POA: Diagnosis not present

## 2023-05-27 DIAGNOSIS — M545 Low back pain, unspecified: Secondary | ICD-10-CM | POA: Diagnosis not present

## 2023-05-29 DIAGNOSIS — M25561 Pain in right knee: Secondary | ICD-10-CM | POA: Diagnosis not present

## 2023-06-03 DIAGNOSIS — M25561 Pain in right knee: Secondary | ICD-10-CM | POA: Diagnosis not present

## 2023-06-03 DIAGNOSIS — R52 Pain, unspecified: Secondary | ICD-10-CM | POA: Diagnosis not present

## 2023-06-05 DIAGNOSIS — M25561 Pain in right knee: Secondary | ICD-10-CM | POA: Diagnosis not present

## 2023-06-05 DIAGNOSIS — G8929 Other chronic pain: Secondary | ICD-10-CM | POA: Diagnosis not present

## 2023-06-11 DIAGNOSIS — M1711 Unilateral primary osteoarthritis, right knee: Secondary | ICD-10-CM | POA: Diagnosis not present

## 2023-06-19 DIAGNOSIS — G4733 Obstructive sleep apnea (adult) (pediatric): Secondary | ICD-10-CM | POA: Diagnosis not present

## 2023-07-25 DIAGNOSIS — M159 Polyosteoarthritis, unspecified: Secondary | ICD-10-CM | POA: Diagnosis not present

## 2023-07-25 DIAGNOSIS — M545 Low back pain, unspecified: Secondary | ICD-10-CM | POA: Diagnosis not present

## 2023-07-25 DIAGNOSIS — E559 Vitamin D deficiency, unspecified: Secondary | ICD-10-CM | POA: Diagnosis not present

## 2023-07-25 DIAGNOSIS — M25561 Pain in right knee: Secondary | ICD-10-CM | POA: Diagnosis not present

## 2023-07-25 DIAGNOSIS — I10 Essential (primary) hypertension: Secondary | ICD-10-CM | POA: Diagnosis not present

## 2023-07-25 DIAGNOSIS — E785 Hyperlipidemia, unspecified: Secondary | ICD-10-CM | POA: Diagnosis not present

## 2023-07-25 DIAGNOSIS — J309 Allergic rhinitis, unspecified: Secondary | ICD-10-CM | POA: Diagnosis not present

## 2023-07-25 DIAGNOSIS — R7303 Prediabetes: Secondary | ICD-10-CM | POA: Diagnosis not present

## 2023-07-25 DIAGNOSIS — G8929 Other chronic pain: Secondary | ICD-10-CM | POA: Diagnosis not present

## 2023-07-25 DIAGNOSIS — N1831 Chronic kidney disease, stage 3a: Secondary | ICD-10-CM | POA: Diagnosis not present

## 2023-07-25 DIAGNOSIS — G2581 Restless legs syndrome: Secondary | ICD-10-CM | POA: Diagnosis not present

## 2023-07-25 DIAGNOSIS — K219 Gastro-esophageal reflux disease without esophagitis: Secondary | ICD-10-CM | POA: Diagnosis not present

## 2023-08-07 ENCOUNTER — Encounter: Payer: Self-pay | Admitting: Internal Medicine

## 2023-09-25 ENCOUNTER — Encounter: Payer: Self-pay | Admitting: Internal Medicine

## 2023-10-24 ENCOUNTER — Ambulatory Visit: Payer: Medicare HMO

## 2023-10-24 ENCOUNTER — Telehealth: Payer: Self-pay

## 2023-10-24 NOTE — Progress Notes (Unsigned)
 No egg or soy allergy known to patient  No issues known to pt with past sedation with any surgeries or procedures Patient denies ever being told they had issues or difficulty with intubation  No FH of Malignant Hyperthermia Pt is not on diet pills Pt is not on  home 02  Pt is not on blood thinners  Pt denies issues with constipation  No A fib or A flutter Have any cardiac testing pending--*** Pt can ambulate  Pt denies use of chewing tobacco Discussed diabetic I weight loss medication holds Discussed NSAID holds Checked BMI Pt instructed to use [employer] or GoodRx for a price reduction on prep  Patient's chart reviewed by Cathlyn Parsons CNRA prior to previsit and patient appropriate for the [facility].  Pre visit completed and red dot placed by patient's name on their procedure day (on provider's schedule).

## 2023-10-24 NOTE — Telephone Encounter (Addendum)
 Left another message letting the patient know that the pre visit was mandatory to proceed with colonoscopy.  Will call back in 3 min   Called back at 9:20 and left message that he needed to call back to reschedule pre visit by 5pm today or colonoscopy would be cancelled.  Patient rescheduled pre visit

## 2023-10-24 NOTE — Telephone Encounter (Signed)
 No answer for PV appointment.  Left message that his PV appt was at 9.  Will call back in 5 min

## 2023-10-24 NOTE — Telephone Encounter (Signed)
 Patient was rescheduled for 2/10 at 2:30 PM.

## 2023-10-27 ENCOUNTER — Ambulatory Visit (AMBULATORY_SURGERY_CENTER): Payer: Medicare HMO

## 2023-10-27 VITALS — Ht 70.0 in | Wt 215.0 lb

## 2023-10-27 DIAGNOSIS — Z860100 Personal history of colon polyps, unspecified: Secondary | ICD-10-CM

## 2023-10-27 MED ORDER — NA SULFATE-K SULFATE-MG SULF 17.5-3.13-1.6 GM/177ML PO SOLN: 1.0000 | Freq: Once | ORAL | 0 refills | Status: AC

## 2023-10-27 NOTE — Progress Notes (Signed)
 No egg or soy allergy known to patient  No issues known to pt with past sedation with any surgeries or procedures Patient denies ever being told they had issues or difficulty with intubation  No FH of Malignant Hyperthermia Pt is not on diet pills Pt is not on home 02  Pt is not on blood thinners  Pt denies issues with chronic constipation  No A fib or A flutter Have any cardiac testing pending--no Ambulates independently

## 2023-11-03 ENCOUNTER — Encounter: Payer: Self-pay | Admitting: Internal Medicine

## 2023-11-07 ENCOUNTER — Ambulatory Visit: Payer: Medicare HMO | Admitting: Internal Medicine

## 2023-11-07 ENCOUNTER — Encounter: Payer: Self-pay | Admitting: Internal Medicine

## 2023-11-07 VITALS — BP 105/49 | HR 73 | Temp 97.8°F | Resp 11 | Ht 70.0 in | Wt 215.0 lb

## 2023-11-07 DIAGNOSIS — K648 Other hemorrhoids: Secondary | ICD-10-CM

## 2023-11-07 DIAGNOSIS — K5730 Diverticulosis of large intestine without perforation or abscess without bleeding: Secondary | ICD-10-CM

## 2023-11-07 DIAGNOSIS — D122 Benign neoplasm of ascending colon: Secondary | ICD-10-CM | POA: Diagnosis not present

## 2023-11-07 DIAGNOSIS — Z85038 Personal history of other malignant neoplasm of large intestine: Secondary | ICD-10-CM

## 2023-11-07 DIAGNOSIS — Z1211 Encounter for screening for malignant neoplasm of colon: Secondary | ICD-10-CM

## 2023-11-07 MED ORDER — SODIUM CHLORIDE 0.9 % IV SOLN: 500.0000 mL | INTRAVENOUS | Status: DC

## 2023-11-07 NOTE — Progress Notes (Signed)
 Called to room to assist during endoscopic procedure.  Patient ID and intended procedure confirmed with present staff. Received instructions for my participation in the procedure from the performing physician.

## 2023-11-07 NOTE — Op Note (Signed)
 [facility] Patient Name: Troy [name] Procedure Date: 11/07/2023 11:43 AM MRN: [id number] Endoscopist: Beverley Fiedler , MD, [id number] Age: 73 Referring MD:  Date of Birth: 1951/09/13 Gender: Male Account #: [id number] Procedure:                Colonoscopy Indications:              High risk colon cancer surveillance: Personal                            history of colon cancer, Last colonoscopy: November                            2019 (no polyps), 2017 (no polyps), 2016                            (endoscopically resected sigmoid caner and                            additional adenomas) Medicines:                Monitored Anesthesia Care Procedure:                Pre-Anesthesia Assessment:                           - Prior to the procedure, a History and Physical                            was performed, and patient medications and                            allergies were reviewed. The patient's tolerance of                            previous anesthesia was also reviewed. The risks                            and benefits of the procedure and the sedation                            options and risks were discussed with the patient.                            All questions were answered, and informed consent                            was obtained. Prior Anticoagulants: The patient has                            taken no anticoagulant or antiplatelet agents. ASA                            Grade Assessment: III - A patient with severe  systemic disease. After reviewing the risks and                            benefits, the patient was deemed in satisfactory                            condition to undergo the procedure.                           After obtaining informed consent, the colonoscope                            was passed under direct vision. Throughout the                            procedure, the patient's blood pressure, pulse, and                             oxygen saturations were monitored continuously. The                            CF [id number] #[id number] was introduced through the anus                            and advanced to the cecum, identified by                            appendiceal orifice and ileocecal valve. The                            colonoscopy was performed without difficulty. The                            patient tolerated the procedure well. The quality                            of the bowel preparation was good. The ileocecal                            valve, appendiceal orifice, and rectum were                            photographed. Scope In: 11:53:35 AM Scope Out: 12:05:14 PM Scope Withdrawal Time: 0 hours 9 minutes 4 seconds  Total Procedure Duration: 0 hours 11 minutes 39 seconds  Findings:                 The digital rectal exam was normal.                           A 4 mm polyp was found in the ascending colon. The                            polyp was sessile. The polyp was removed with a  cold snare. Resection and retrieval were complete.                           A tattoo was seen in the sigmoid colon. A                            post-polypectomy scar was found at the tattoo site.                            There was no evidence of residual polyp tissue.                           Multiple large-mouthed, medium-mouthed and                            small-mouthed diverticula were found in the sigmoid                            colon and descending colon.                           Internal hemorrhoids were found during                            retroflexion. The hemorrhoids were small. Complications:            No immediate complications. Estimated Blood Loss:     Estimated blood loss: none. Impression:               - One 4 mm polyp in the ascending colon, removed                            with a cold snare. Resected and retrieved.                           - A  tattoo was seen in the sigmoid colon. A                            post-polypectomy scar was found at the tattoo site.                            There was no evidence of residual polyp tissue.                           - Severe diverticulosis in the sigmoid colon and in                            the descending colon.                           - Small internal hemorrhoids. Recommendation:           - Patient has a contact number available for  emergencies. The signs and symptoms of potential                            delayed complications were discussed with the                            patient. Return to normal activities tomorrow.                            Written discharge instructions were provided to the                            patient.                           - Resume previous diet.                           - Continue present medications.                           - Await pathology results.                           - Repeat colonoscopy in 5 years for surveillance. Beverley Fiedler, MD 11/07/2023 12:08:28 PM This report has been signed electronically.

## 2023-11-07 NOTE — Patient Instructions (Signed)

## 2023-11-07 NOTE — Progress Notes (Signed)
 GASTROENTEROLOGY PROCEDURE H&P NOTE   Primary Care Physician: Simone Curia, MD    Reason for Procedure:  History of colon cancer  Plan:    Colonoscopy  Patient is appropriate for endoscopic procedure(s) in the ambulatory ([facility]) setting.  The nature of the procedure, as well as the risks, benefits, and alternatives were carefully and thoroughly reviewed with the patient. Ample time for discussion and questions allowed. The patient understood, was satisfied, and agreed to proceed.     HPI: Troy [name] is a 73 y.o. male who presents for surveillance colonoscopy.  Medical history as below.  Tolerated the prep.  No recent chest pain or shortness of breath.  No abdominal pain today.  Past Medical History:  Diagnosis Date   Allergy    Arthritis    Cataract    surgery   GERD (gastroesophageal reflux disease)    Hypertension    does not see a cardiologist   Sleep apnea    Wears C-Pap    Past Surgical History:  Procedure Laterality Date   ABCESS DRAINAGE     right neck   CATARACT EXTRACTION Right    COLONOSCOPY     ORIF WRIST FRACTURE Right 03/06/2013   Procedure: OPEN REDUCTION INTERNAL FIXATION (ORIF) RIGHT WRIST FRACTURE with allograft bone graft and repair reconstruction as necessary;  Surgeon: Dominica Severin, MD;  Location: [facility] OR;  Service: Orthopedics;  Laterality: Right;    Prior to Admission medications   Medication Sig Start Date End Date Taking? Authorizing Provider  aspirin 81 MG tablet Take 81 mg by mouth daily.   Yes [provider]  atenolol (TENORMIN) 25 MG tablet Take 25 mg by mouth daily.   Yes [provider]  buPROPion (WELLBUTRIN SR) 150 MG 12 hr tablet Take 150 mg by mouth in the morning and at bedtime.   Yes [provider]  celecoxib (CELEBREX) 200 MG capsule 3 (three) times a week. 02/14/21  Yes [provider]  ibuprofen (ADVIL) 200 MG tablet Take by mouth as needed.   Yes [provider]   losartan-hydrochlorothiazide (HYZAAR) 50-12.5 MG per tablet Take 1 tablet by mouth daily.   Yes [provider]  omeprazole (PRILOSEC) 10 MG capsule Take 20 mg by mouth daily.   Yes [provider]  omeprazole (PRILOSEC) 20 MG capsule Take 20 mg by mouth daily. 10/30/23  Yes [provider]  pregabalin (LYRICA) 75 MG capsule Take 75 mg by mouth 2 (two) times daily.   Yes [provider]  Calcium Carbonate-Vitamin D 600-400 MG-UNIT per tablet Take 1 tablet by mouth daily. Patient not taking: Reported on 11/07/2023    [provider]  fluticasone (FLONASE) 50 MCG/ACT nasal spray Place into both nostrils as needed. Patient not taking: Reported on 10/27/2023    [provider]  Glucosamine-Chondroit-Vit C-Mn (GLUCOSAMINE 1500 COMPLEX) CAPS Take by mouth. Patient not taking: Reported on 11/07/2023    [provider]  lactobacillus acidophilus (BACID) TABS tablet Take 2 tablets by mouth 3 (three) times daily.    [provider]  Misc Natural Products (OSTEO BI-FLEX JOINT SHIELD PO) Take by mouth. Patient not taking: Reported on 11/07/2023    [provider]  Multiple Vitamin (MULTIVITAMIN WITH MINERALS) TABS Take 1 tablet by mouth daily. Patient not taking: Reported on 11/07/2023    [provider]  rosuvastatin (CRESTOR) 5 MG tablet Take 5 mg by mouth daily. Patient not taking: Reported on 11/07/2023    [provider]  Vitamin D, Ergocalciferol, (DRISDOL) 50000 UNITS CAPS capsule Take 50,000 Units by mouth every 7 (seven) days. Patient not taking: Reported on 11/07/2023    [provider]    Current Outpatient Medications  Medication Sig Dispense Refill   aspirin 81 MG tablet Take 81 mg by mouth daily.     atenolol (TENORMIN) 25 MG tablet Take 25 mg by mouth daily.     buPROPion (WELLBUTRIN SR) 150 MG 12 hr tablet Take 150 mg by mouth in the morning and at bedtime.     celecoxib (CELEBREX) 200  MG capsule 3 (three) times a week.     ibuprofen (ADVIL) 200 MG tablet Take by mouth as needed.     losartan-hydrochlorothiazide (HYZAAR) 50-12.5 MG per tablet Take 1 tablet by mouth daily.     omeprazole (PRILOSEC) 10 MG capsule Take 20 mg by mouth daily.     omeprazole (PRILOSEC) 20 MG capsule Take 20 mg by mouth daily.     pregabalin (LYRICA) 75 MG capsule Take 75 mg by mouth 2 (two) times daily.     Calcium Carbonate-Vitamin D 600-400 MG-UNIT per tablet Take 1 tablet by mouth daily. (Patient not taking: Reported on 11/07/2023)     fluticasone (FLONASE) 50 MCG/ACT nasal spray Place into both nostrils as needed. (Patient not taking: Reported on 10/27/2023)     Glucosamine-Chondroit-Vit C-Mn (GLUCOSAMINE 1500 COMPLEX) CAPS Take by mouth. (Patient not taking: Reported on 11/07/2023)     lactobacillus acidophilus (BACID) TABS tablet Take 2 tablets by mouth 3 (three) times daily.     Misc Natural Products (OSTEO BI-FLEX JOINT SHIELD PO) Take by mouth. (Patient not taking: Reported on 11/07/2023)     Multiple Vitamin (MULTIVITAMIN WITH MINERALS) TABS Take 1 tablet by mouth daily. (Patient not taking: Reported on 11/07/2023)     rosuvastatin (CRESTOR) 5 MG tablet Take 5 mg by mouth daily. (Patient not taking: Reported on 11/07/2023)     Vitamin D, Ergocalciferol, (DRISDOL) 50000 UNITS CAPS capsule Take 50,000 Units by mouth every 7 (seven) days. (Patient not taking: Reported on 11/07/2023)     Current Facility-Administered Medications  Medication Dose Route Frequency Provider Last Rate Last Admin   0.9 %  sodium chloride infusion  500 mL Intravenous Continuous Areg Bialas, Carie Caddy, MD        Allergies as of 11/07/2023 - Review Complete 11/07/2023  Allergen Reaction Noted   Clindamycin/lincomycin Hives 03/04/2013    Family History  Problem Relation Age of Onset   Colon cancer Neg Hx    Esophageal cancer Neg Hx    Rectal cancer Neg Hx    Stomach cancer Neg Hx     Social History   Socioeconomic History    Marital status: Divorced    Spouse name: Not on file   Number of children: Not on file   Years of education: Not on file   Highest education level: Not on file  Occupational History   Not on file  Tobacco Use   Smoking status: Never   Smokeless tobacco: Never  Vaping Use   Vaping status: Never Used  Substance and Sexual Activity   Alcohol use: No    Alcohol/week: 0.0 standard drinks of alcohol   Drug use: No   Sexual activity: Not on file  Other Topics Concern   Not on file  Social History Narrative   Not on file   Social Drivers of Health   Financial Resource Strain: Not on file  Food Insecurity: Not on file  Transportation Needs: Not on file  Physical Activity: Not on file  Stress: Not on file  Social Connections: Unknown (09/18/2022)   Received from [facility], [facility]   Social Network    Social Network: Not on file  Intimate Partner Violence: Unknown (09/18/2022)   Received from [facility], [facility]   HITS    Physically Hurt: Not on file    Insult or Talk Down To: Not on file    Threaten Physical Harm: Not on file    Scream or Curse: Not on file    Physical Exam: Vital signs in last 24 hours: @BP  135/69   Pulse 73   Temp 97.8 F (36.6 C)   Ht 5\' 10"  (1.778 m)   Wt 215 lb (97.5 kg)   SpO2 95%   BMI 30.85 kg/m  GEN: NAD EYE: Sclerae anicteric ENT: MMM CV: Non-tachycardic Pulm: CTA b/l GI: Soft, NT/ND NEURO:  Alert & Oriented x 3   Erick Blinks, MD [facility]  11/07/2023 11:42 AM

## 2023-11-10 ENCOUNTER — Telehealth: Payer: Self-pay | Admitting: *Deleted

## 2023-11-10 NOTE — Telephone Encounter (Signed)
 Attempted post procedure follow up call.  No answer - LVM.

## 2023-11-11 LAB — SURGICAL PATHOLOGY

## 2023-11-13 ENCOUNTER — Encounter: Payer: Self-pay | Admitting: Internal Medicine
